# Patient Record
Sex: Female | Born: 1995 | Marital: Married | Smoking: Never smoker
Health system: Southern US, Community
[De-identification: ages and names within clinical notes are randomized; demographics above are authoritative.]

## PROBLEM LIST (undated history)

## (undated) DIAGNOSIS — F419 Anxiety disorder, unspecified: Secondary | ICD-10-CM

## (undated) DIAGNOSIS — G90A Postural orthostatic tachycardia syndrome (POTS): Secondary | ICD-10-CM

## (undated) DIAGNOSIS — R000 Tachycardia, unspecified: Secondary | ICD-10-CM

## (undated) DIAGNOSIS — K219 Gastro-esophageal reflux disease without esophagitis: Secondary | ICD-10-CM

## (undated) DIAGNOSIS — I951 Orthostatic hypotension: Secondary | ICD-10-CM

## (undated) DIAGNOSIS — I498 Other specified cardiac arrhythmias: Secondary | ICD-10-CM

## (undated) HISTORY — DX: Anxiety disorder, unspecified: F41.9

## (undated) HISTORY — DX: Other specified cardiac arrhythmias: I49.8

## (undated) HISTORY — PX: NO PAST SURGERIES: SHX2092

## (undated) HISTORY — DX: Orthostatic hypotension: I95.1

## (undated) HISTORY — DX: Gastro-esophageal reflux disease without esophagitis: K21.9

## (undated) HISTORY — DX: Postural orthostatic tachycardia syndrome (POTS): G90.A

## (undated) HISTORY — DX: Tachycardia, unspecified: R00.0

---

## 2017-02-09 ENCOUNTER — Encounter: Payer: Self-pay | Admitting: Obstetrics and Gynecology

## 2017-02-09 ENCOUNTER — Ambulatory Visit (INDEPENDENT_AMBULATORY_CARE_PROVIDER_SITE_OTHER): Payer: BLUE CROSS/BLUE SHIELD | Admitting: Obstetrics and Gynecology

## 2017-02-09 VITALS — BP 118/74 | Ht 64.0 in | Wt 150.0 lb

## 2017-02-09 DIAGNOSIS — Z1331 Encounter for screening for depression: Secondary | ICD-10-CM

## 2017-02-09 DIAGNOSIS — Z3041 Encounter for surveillance of contraceptive pills: Secondary | ICD-10-CM

## 2017-02-09 DIAGNOSIS — Z124 Encounter for screening for malignant neoplasm of cervix: Secondary | ICD-10-CM

## 2017-02-09 DIAGNOSIS — Z1389 Encounter for screening for other disorder: Secondary | ICD-10-CM | POA: Diagnosis not present

## 2017-02-09 DIAGNOSIS — Z1339 Encounter for screening examination for other mental health and behavioral disorders: Secondary | ICD-10-CM

## 2017-02-09 DIAGNOSIS — Z01419 Encounter for gynecological examination (general) (routine) without abnormal findings: Secondary | ICD-10-CM | POA: Diagnosis not present

## 2017-02-09 LAB — HM PAP SMEAR: HM Pap smear: NORMAL

## 2017-02-09 MED ORDER — NORETHINDRONE 0.35 MG PO TABS: 1.0000 | ORAL_TABLET | Freq: Every day | ORAL | 4 refills | Status: DC

## 2017-02-09 NOTE — Progress Notes (Signed)
Gynecology Annual Exam  PCP: Patient, No Pcp Per  Chief Complaint  Patient presents with  . Annual Exam   History of Present Illness:  [name] is a 21 y.o. G0P0000 who LMP was Patient's last menstrual period was 01/19/2017., presents today for her annual examination.  Her menses are irregular, lasting a few days.  Dysmenorrhea none.   She is single partner, contraception - oral progesterone-only contraceptive.  Last Pap: never had Hx of STDs: none  There is a FH of breast cancer. Her mother was diagnosed at age 22. There is no FH of ovarian cancer. The patient does not do self-breast exams.  Tobacco use: The patient denies current or previous tobacco use. Alcohol use: social drinker Exercise: moderately active  The patient wears seatbelts: yes.   The patient reports that domestic violence in her life is absent.   Past Medical History:  Diagnosis Date  . Anxiety   . POTS (postural orthostatic tachycardia syndrome)     No past surgical history on file.  Prior to Admission medications   Medication Sig Start Date End Date Taking? Authorizing Provider  metoprolol tartrate (LOPRESSOR) 50 MG tablet Take 50 mg by mouth 2 (two) times daily.   Yes [provider]  midodrine (PROAMATINE) 5 MG tablet Take 5 mg by mouth 3 (three) times daily with meals.   Yes [provider]  norethindrone (MICRONOR,CAMILA,ERRIN) 0.35 MG tablet Take 1 tablet by mouth daily.   Yes [provider]    Allergies  Allergen Reactions  . Citrus    Gynecologic History: Patient's last menstrual period was 01/19/2017.  Obstetric History: G0P0000  Social History   Social History  . Marital status: Married    Spouse name: N/A  . Number of children: N/A  . Years of education: N/A   Occupational History  . Not on file.   Social History Main Topics  . Smoking status: Never Smoker  . Smokeless tobacco: Never Used  . Alcohol use Yes  . Drug use: No  . Sexual  activity: Yes    Birth control/ protection: Pill   Other Topics Concern  . Not on file   Social History Narrative  . No narrative on file    Family History  Problem Relation Age of Onset  . Breast cancer Mother   . Hypertension Father   . Diabetes Mellitus II Paternal Aunt   . Diabetes Mellitus II Paternal Grandfather     Review of Systems  Constitutional: Negative.   HENT: Negative.   Eyes: Negative.   Respiratory: Negative.   Cardiovascular: Negative.   Gastrointestinal: Negative.   Genitourinary: Negative.   Musculoskeletal: Negative.   Skin: Negative.   Neurological: Negative.   Psychiatric/Behavioral: Negative.      Physical Exam BP 118/74   Ht 5\' 4"  (1.626 m)   Wt 150 lb (68 kg)   LMP 01/19/2017   BMI 25.75 kg/m    Physical Exam  Constitutional: She is oriented to person, place, and time. She appears well-developed and well-nourished. No distress.  Genitourinary: Vagina normal and uterus normal. Pelvic exam was performed with patient supine. There is no rash, tenderness or lesion on the right labia. There is no rash, tenderness or lesion on the left labia. Vagina exhibits no lesion. No tenderness or bleeding in the vagina. No signs of injury around the vagina. Right adnexum does not display mass, does not display tenderness and does not display fullness. Left adnexum does not display mass, does not  display tenderness and does not display fullness.  Cervix is nulliparous. Cervix does not exhibit motion tenderness, lesion, friability or polyp.   Uterus is mobile and anteverted. Uterus is not enlarged, tender, exhibiting a mass or irregular (is regular).  HENT:  Head: Normocephalic and atraumatic.  Eyes: EOM are normal. No scleral icterus.  Neck: Normal range of motion. Neck supple. No thyromegaly present.  Cardiovascular: Normal rate, regular rhythm and normal heart sounds.  Exam reveals no gallop and no friction rub.   No murmur heard. Pulmonary/Chest: Effort  normal and breath sounds normal. No respiratory distress. She has no wheezes. She has no rales.  Abdominal: Soft. Bowel sounds are normal. She exhibits no distension and no mass. There is no tenderness. There is no rebound and no guarding.  Musculoskeletal: Normal range of motion. She exhibits no edema.  Lymphadenopathy:    She has no cervical adenopathy.  Neurological: She is alert and oriented to person, place, and time. No cranial nerve deficit.  Skin: Skin is warm and dry. No erythema.  Psychiatric: She has a normal mood and affect. Her behavior is normal. Judgment normal.   Female chaperone present for pelvic and breast  portions of the physical exam  Results: AUDIT Questionnaire (screen for alcoholism): 3 PHQ-9: 11 (states she is doing well. Symptoms related to POTS)  Assessment: 21 y.o. G0P0000 female here for routine annual gynecologic examination  Plan: Problem List Items Addressed This Visit    None    Visit Diagnoses    Women's annual routine gynecological examination    -  Primary   Relevant Medications   norethindrone (MICRONOR,CAMILA,ERRIN) 0.35 MG tablet   Other Relevant Orders   IGP, rfx Aptima HPV ASCU   Pap smear for cervical cancer screening       Relevant Orders   IGP, rfx Aptima HPV ASCU   Screening for alcohol problem       Screening for depression       Encounter for surveillance of contraceptive pills       Relevant Medications   norethindrone (MICRONOR,CAMILA,ERRIN) 0.35 MG tablet      Screening: -- Blood pressure screen normal -- Weight screening: normal -- Depression screening negative (PHQ-9) -- Nutrition: normal -- cholesterol screening: not due for screening -- osteoporosis screening: not due -- tobacco screening: not using -- alcohol screening: AUDIT questionnaire indicates low-risk usage. -- family history of breast cancer screening: done. not at high risk. -- no evidence of domestic violence or intimate partner violence. -- STD  screening: gonorrhea/chlamydia NAAT not collected per patient request. -- pap smear collected per ASCCP guidelines  Thomasene MohairStephen Deairra Halleck, MD 02/09/2017 2:24 PM

## 2017-02-12 LAB — IGP, RFX APTIMA HPV ASCU: PAP SMEAR COMMENT: 0

## 2017-02-16 ENCOUNTER — Encounter: Payer: Self-pay | Admitting: Obstetrics and Gynecology

## 2017-07-27 ENCOUNTER — Encounter: Payer: Self-pay | Admitting: Obstetrics and Gynecology

## 2017-07-27 ENCOUNTER — Ambulatory Visit: Payer: BLUE CROSS/BLUE SHIELD | Admitting: Obstetrics and Gynecology

## 2017-07-27 VITALS — BP 118/74 | Wt 153.0 lb

## 2017-07-27 DIAGNOSIS — N760 Acute vaginitis: Secondary | ICD-10-CM

## 2017-07-27 DIAGNOSIS — B9689 Other specified bacterial agents as the cause of diseases classified elsewhere: Secondary | ICD-10-CM

## 2017-07-27 LAB — POCT WET PREP WITH KOH
KOH Prep POC: NEGATIVE
PH, VAGINAL: 4.5
TRICHOMONAS UA: NEGATIVE

## 2017-07-27 MED ORDER — METRONIDAZOLE 500 MG PO TABS: 500.0000 mg | ORAL_TABLET | Freq: Two times a day (BID) | ORAL | 0 refills | Status: AC

## 2017-07-27 NOTE — Progress Notes (Signed)
Obstetrics & Gynecology Office Visit   Chief Complaint  Patient presents with  . Vaginitis  . Vaginal Discharge    light pink d/c   History of Present Illness: Vaginitis: Patient complains of an abnormal vaginal discharge for 2 months. Vaginal symptoms include odor and vulvar itching.Vulvar symptoms include none.STI Risk: Very low risk of STD exposureDischarge described as: malodorous.Other associated symptoms: local irritation and odor.Menstrual pattern: She had been bleeding irregularly. Contraception: oral progesterone-only contraceptive.  Several month history of abnormal odor.  She had a menses last week lasting until Friday. She had some light-pink spotting, malodorous discharge, and occasional pink spotting on the tissue.     Past Medical History:  Diagnosis Date  . Anxiety   . POTS (postural orthostatic tachycardia syndrome)     Past Surgical History:  Procedure Laterality Date  . NO PAST SURGERIES      Gynecologic History: Patient's last menstrual period was 07/19/2017.  Obstetric History: G0P0000  Family History  Problem Relation Age of Onset  . Breast cancer Mother   . Hypertension Father   . Diabetes Mellitus II Paternal Aunt   . Diabetes Mellitus II Paternal Grandfather     Social History   Socioeconomic History  . Marital status: Married    Spouse name: Not on file  . Number of children: Not on file  . Years of education: Not on file  . Highest education level: Not on file  Social Needs  . Financial resource strain: Not on file  . Food insecurity - worry: Not on file  . Food insecurity - inability: Not on file  . Transportation needs - medical: Not on file  . Transportation needs - non-medical: Not on file  Occupational History  . Not on file  Tobacco Use  . Smoking status: Never Smoker  . Smokeless tobacco: Never Used  Substance and Sexual Activity  . Alcohol use: Yes  . Drug use: No  . Sexual activity: Yes    Birth control/protection: Pill    Other Topics Concern  . Not on file  Social History Narrative  . Not on file    Allergies  Allergen Reactions  . Citrus     Prior to Admission medications   Medication Sig Start Date End Date Taking? Authorizing Provider  metoprolol tartrate (LOPRESSOR) 50 MG tablet Take 50 mg by mouth 2 (two) times daily.    [provider]  midodrine (PROAMATINE) 5 MG tablet Take 5 mg by mouth 3 (three) times daily with meals.    [provider]  norethindrone (MICRONOR,CAMILA,ERRIN) 0.35 MG tablet Take 1 tablet (0.35 mg total) by mouth daily. 02/09/17 05/04/17  Conard Novak, MD   Review of Systems  Constitutional: Negative.  Negative for fever.  HENT: Negative.   Eyes: Negative.   Respiratory: Negative.   Cardiovascular: Negative.   Gastrointestinal: Negative.  Negative for abdominal pain and blood in stool.  Genitourinary: Negative.        See HPI  Musculoskeletal: Negative.   Skin: Negative.   Neurological: Negative.   Psychiatric/Behavioral: Negative.      Physical Exam BP 118/74   Wt 153 lb (69.4 kg)   LMP 07/19/2017   BMI 26.26 kg/m  Patient's last menstrual period was 07/19/2017. Physical Exam  Constitutional: She appears well-developed and well-nourished. No distress.  Genitourinary: Vagina normal and uterus normal. Pelvic exam was performed with patient supine. There is no rash, tenderness or lesion on the right labia. There is no rash, tenderness or lesion  on the left labia. No tenderness or bleeding in the vagina. No vaginal discharge found. Right adnexum does not display mass, does not display tenderness and does not display fullness. Left adnexum does not display mass, does not display tenderness and does not display fullness. Cervix does not exhibit motion tenderness, lesion, discharge or polyp.   Uterus is mobile and anteverted. Uterus is not enlarged, tender, exhibiting a mass or irregular (is regular).  Cardiovascular: Normal rate and regular  rhythm.  Pulmonary/Chest: Effort normal and breath sounds normal.  Abdominal: Soft. Bowel sounds are normal. She exhibits no distension and no mass. There is no tenderness. There is no guarding.  Psychiatric: She has a normal mood and affect. Her behavior is normal. Judgment normal.   Female chaperone present for pelvic and breast  portions of the physical exam  Wet Prep: PH: 4.5 Clue Cells: Positive Fungal elements: Negative Trichomonas: Negative  Assessment: 22 y.o. G0P0000 female here for  1. Bacterial vaginosis      Plan: Problem List Items Addressed This Visit    None    Visit Diagnoses    Bacterial vaginosis    -  Primary   Relevant Medications   metroNIDAZOLE (FLAGYL) 500 MG tablet   Other Relevant Orders   POCT Wet Prep with KOH (Completed)      Thomasene MohairStephen Makendra Vigeant, MD 07/27/2017 5:30 PM

## 2017-07-27 NOTE — Patient Instructions (Signed)
Bacterial Vaginosis Bacterial vaginosis is a vaginal infection that occurs when the normal balance of bacteria in the vagina is disrupted. It results from an overgrowth of certain bacteria. This is the most common vaginal infection among women ages 15-44. Because bacterial vaginosis increases your risk for STIs (sexually transmitted infections), getting treated can help reduce your risk for chlamydia, gonorrhea, herpes, and HIV (human immunodeficiency virus). Treatment is also important for preventing complications in pregnant women, because this condition can cause an early (premature) delivery. What are the causes? This condition is caused by an increase in harmful bacteria that are normally present in small amounts in the vagina. However, the reason that the condition develops is not fully understood. What increases the risk? The following factors may make you more likely to develop this condition:  Having a new sexual partner or multiple sexual partners.  Having unprotected sex.  Douching.  Having an intrauterine device (IUD).  Smoking.  Drug and alcohol abuse.  Taking certain antibiotic medicines.  Being pregnant.  You cannot get bacterial vaginosis from toilet seats, bedding, swimming pools, or contact with objects around you. What are the signs or symptoms? Symptoms of this condition include:  Grey or white vaginal discharge. The discharge can also be watery or foamy.  A fish-like odor with discharge, especially after sexual intercourse or during menstruation.  Itching in and around the vagina.  Burning or pain with urination.  Some women with bacterial vaginosis have no signs or symptoms. How is this diagnosed? This condition is diagnosed based on:  Your medical history.  A physical exam of the vagina.  Testing a sample of vaginal fluid under a microscope to look for a large amount of bad bacteria or abnormal cells. Your health care provider may use a cotton swab  or a small wooden spatula to collect the sample.  How is this treated? This condition is treated with antibiotics. These may be given as a pill, a vaginal cream, or a medicine that is put into the vagina (suppository). If the condition comes back after treatment, a second round of antibiotics may be needed. Follow these instructions at home: Medicines  Take over-the-counter and prescription medicines only as told by your health care provider.  Take or use your antibiotic as told by your health care provider. Do not stop taking or using the antibiotic even if you start to feel better. General instructions  If you have a female sexual partner, tell her that you have a vaginal infection. She should see her health care provider and be treated if she has symptoms. If you have a female sexual partner, he does not need treatment.  During treatment: ? Avoid sexual activity until you finish treatment. ? Do not douche. ? Avoid alcohol as directed by your health care provider. ? Avoid breastfeeding as directed by your health care provider.  Drink enough water and fluids to keep your urine clear or pale yellow.  Keep the area around your vagina and rectum clean. ? Wash the area daily with warm water. ? Wipe yourself from front to back after using the toilet.  Keep all follow-up visits as told by your health care provider. This is important. How is this prevented?  Do not douche.  Wash the outside of your vagina with warm water only.  Use protection when having sex. This includes latex condoms and dental dams.  Limit how many sexual partners you have. To help prevent bacterial vaginosis, it is best to have sex with just   one partner (monogamous).  Make sure you and your sexual partner are tested for STIs.  Wear cotton or cotton-lined underwear.  Avoid wearing tight pants and pantyhose, especially during summer.  Limit the amount of alcohol that you drink.  Do not use any products that  contain nicotine or tobacco, such as cigarettes and e-cigarettes. If you need help quitting, ask your health care provider.  Do not use illegal drugs. Where to find more information:  Centers for Disease Control and Prevention: www.cdc.gov/std  American Sexual Health Association (ASHA): www.ashastd.org  [place] Department of Health and Human Services, Office on Women's Health: www.womenshealth.gov/ or https://www.womenshealth.gov/a-z-topics/bacterial-vaginosis Contact a health care provider if:  Your symptoms do not improve, even after treatment.  You have more discharge or pain when urinating.  You have a fever.  You have pain in your abdomen.  You have pain during sex.  You have vaginal bleeding between periods. Summary  Bacterial vaginosis is a vaginal infection that occurs when the normal balance of bacteria in the vagina is disrupted.  Because bacterial vaginosis increases your risk for STIs (sexually transmitted infections), getting treated can help reduce your risk for chlamydia, gonorrhea, herpes, and HIV (human immunodeficiency virus). Treatment is also important for preventing complications in pregnant women, because the condition can cause an early (premature) delivery.  This condition is treated with antibiotic medicines. These may be given as a pill, a vaginal cream, or a medicine that is put into the vagina (suppository). This information is not intended to replace advice given to you by your health care provider. Make sure you discuss any questions you have with your health care provider. Document Released: 06/28/2005 Document Revised: 11/01/2016 Document Reviewed: 03/13/2016 Elsevier Interactive Patient Education  2018 [employer]  

## 2017-12-06 ENCOUNTER — Encounter: Payer: Self-pay | Admitting: Emergency Medicine

## 2017-12-06 ENCOUNTER — Emergency Department: Payer: BLUE CROSS/BLUE SHIELD

## 2017-12-06 ENCOUNTER — Emergency Department
Admission: EM | Admit: 2017-12-06 | Discharge: 2017-12-06 | Disposition: A | Discharge status: Home / Self Care | Payer: BLUE CROSS/BLUE SHIELD | Attending: Emergency Medicine | Admitting: Emergency Medicine

## 2017-12-06 ENCOUNTER — Other Ambulatory Visit: Payer: Self-pay

## 2017-12-06 DIAGNOSIS — R197 Diarrhea, unspecified: Secondary | ICD-10-CM | POA: Insufficient documentation

## 2017-12-06 DIAGNOSIS — R1084 Generalized abdominal pain: Secondary | ICD-10-CM | POA: Diagnosis present

## 2017-12-06 DIAGNOSIS — Z79899 Other long term (current) drug therapy: Secondary | ICD-10-CM | POA: Insufficient documentation

## 2017-12-06 DIAGNOSIS — R0602 Shortness of breath: Secondary | ICD-10-CM | POA: Insufficient documentation

## 2017-12-06 DIAGNOSIS — E860 Dehydration: Secondary | ICD-10-CM | POA: Insufficient documentation

## 2017-12-06 DIAGNOSIS — R0781 Pleurodynia: Secondary | ICD-10-CM

## 2017-12-06 DIAGNOSIS — R55 Syncope and collapse: Secondary | ICD-10-CM | POA: Insufficient documentation

## 2017-12-06 LAB — COMPREHENSIVE METABOLIC PANEL
ALK PHOS: 57 U/L (ref 38–126)
ALT: 23 U/L (ref 14–54)
ANION GAP: 7 (ref 5–15)
AST: 23 U/L (ref 15–41)
Albumin: 4.9 g/dL (ref 3.5–5.0)
BILIRUBIN TOTAL: 0.8 mg/dL (ref 0.3–1.2)
BUN: 16 mg/dL (ref 6–20)
CALCIUM: 9.4 mg/dL (ref 8.9–10.3)
CO2: 22 mmol/L (ref 22–32)
Chloride: 106 mmol/L (ref 101–111)
Creatinine, Ser: 0.71 mg/dL (ref 0.44–1.00)
GFR calc Af Amer: >60 mL/min (ref >60)
Glucose, Bld: 122 mg/dL — ABNORMAL HIGH (ref 65–99)
Potassium: 3.8 mmol/L (ref 3.5–5.1)
Sodium: 135 mmol/L (ref 135–145)
TOTAL PROTEIN: 8.1 g/dL (ref 6.5–8.1)

## 2017-12-06 LAB — POCT PREGNANCY, URINE: PREG TEST UR: NEGATIVE

## 2017-12-06 LAB — URINALYSIS, COMPLETE (UACMP) WITH MICROSCOPIC
BACTERIA UA: NONE SEEN
BILIRUBIN URINE: NEGATIVE
Glucose, UA: NEGATIVE mg/dL
Ketones, ur: NEGATIVE mg/dL
NITRITE: NEGATIVE
PH: 5 (ref 5.0–8.0)
Protein, ur: NEGATIVE mg/dL
SPECIFIC GRAVITY, URINE: 1.016 (ref 1.005–1.030)

## 2017-12-06 LAB — CBC
HCT: 40.8 % (ref 35.0–47.0)
HEMOGLOBIN: 14.3 g/dL (ref 12.0–16.0)
MCH: 31.9 pg (ref 26.0–34.0)
MCHC: 35 g/dL (ref 32.0–36.0)
MCV: 91.1 fL (ref 80.0–100.0)
Platelets: 307 10*3/uL (ref 150–440)
RBC: 4.47 MIL/uL (ref 3.80–5.20)
RDW: 12.9 % (ref 11.5–14.5)
WBC: 11.5 10*3/uL — ABNORMAL HIGH (ref 3.6–11.0)

## 2017-12-06 LAB — TSH: TSH: 2.069 u[IU]/mL (ref 0.350–4.500)

## 2017-12-06 LAB — FIBRIN DERIVATIVES D-DIMER (ARMC ONLY): FIBRIN DERIVATIVES D-DIMER (ARMC): 851.97 ng{FEU}/mL — AB (ref 0.00–499.00)

## 2017-12-06 LAB — TROPONIN I

## 2017-12-06 LAB — T4, FREE: FREE T4: 0.74 ng/dL — AB (ref 0.82–1.77)

## 2017-12-06 MED ORDER — IOPAMIDOL (ISOVUE-370) INJECTION 76%
75.0000 mL | Freq: Once | INTRAVENOUS | Status: AC | PRN
  Administered 2017-12-06: 75 mL via INTRAVENOUS

## 2017-12-06 MED ORDER — SODIUM CHLORIDE 0.9 % IV BOLUS
1000.0000 mL | Freq: Once | INTRAVENOUS | Status: AC
  Administered 2017-12-06: 1000 mL via INTRAVENOUS

## 2017-12-06 MED ORDER — ONDANSETRON HCL 4 MG/2ML IJ SOLN
4.0000 mg | Freq: Once | INTRAMUSCULAR | Status: AC
  Administered 2017-12-06: 4 mg via INTRAVENOUS
  Filled 2017-12-06: qty 2

## 2017-12-06 NOTE — Discharge Instructions (Signed)
Your blood tests today are all normal.  Your CT scan of the chest was normal as well and does not show any pneumonia, collapsed lung, or blood clot in the lungs.  Continue drinking lots of fluids and follow up with your doctor for continued monitoring of your symptoms.

## 2017-12-06 NOTE — ED Provider Notes (Signed)
[facility] Emergency Department Provider Note  ____________________________________________  Time seen: Approximately 11:14 AM  I have reviewed the triage vital signs and the nursing notes.   HISTORY  Chief Complaint Abdominal Pain; Loss of Consciousness; and Diarrhea    HPI Denise Tucker is a 22 y.o. female who complains of generalized abdominal pain, generalized weakness, diarrhea since yesterday. This morning she got dizzy and thinks he passed out briefly. Denies any significant injury. she does feel like she is mildly short of breath and has discomfort when she takes a deep breath. Otherwise no chest pain. No recent travel, hospitalization or surgery. Only medical history is of POTS. patient denies fevers chills or sweats, no headache back pain or belly pain..    Past Medical History:  Diagnosis Date  . Anxiety   . POTS (postural orthostatic tachycardia syndrome)      There are no active problems to display for this patient.    Past Surgical History:  Procedure Laterality Date  . NO PAST SURGERIES       Prior to Admission medications   Medication Sig Start Date End Date Taking? Authorizing Provider  metoprolol tartrate (LOPRESSOR) 50 MG tablet Take 50 mg by mouth 2 (two) times daily.    [provider]  midodrine (PROAMATINE) 5 MG tablet Take 5 mg by mouth 3 (three) times daily with meals.    [provider]  norethindrone (MICRONOR,CAMILA,ERRIN) 0.35 MG tablet Take 1 tablet (0.35 mg total) by mouth daily. 02/09/17 05/04/17  Conard Novak, MD     Allergies Citrus   Family History  Problem Relation Age of Onset  . Breast cancer Mother   . Hypertension Father   . Diabetes Mellitus II Paternal Aunt   . Diabetes Mellitus II Paternal Grandfather     Social History Social History   Tobacco Use  . Smoking status: Never Smoker  . Smokeless tobacco: Never Used  Substance Use Topics  . Alcohol use: Yes  . Drug  use: No    Review of Systems  Constitutional:   No fever or chills.  ENT:   No sore throat. No rhinorrhea. Cardiovascular:  positive as above for chest discomfort and possible syncope. Respiratory:  positive shortness of breath without cough. Gastrointestinal:  positive as above for abdominal pain and diarrhea Musculoskeletal:   Negative for focal pain or swelling All other systems reviewed and are negative except as documented above in ROS and HPI.  ____________________________________________   PHYSICAL EXAM:  VITAL SIGNS: ED Triage Vitals  Enc Vitals Group     BP 12/06/17 0704 130/83     Pulse Rate 12/06/17 0704 (!) 141     Resp 12/06/17 0704 18     Temp 12/06/17 0704 98.7 F (37.1 C)     Temp Source 12/06/17 0704 Oral     SpO2 12/06/17 0704 99 %     Weight 12/06/17 0647 130 lb (59 kg)     Height 12/06/17 0647  (1.651 m)     Head Circumference --      Peak Flow --      Pain Score 12/06/17 0647 7     Pain Loc --      Pain Edu? --      Excl. in GC? --     Vital signs reviewed, nursing assessments reviewed.   Constitutional:   Alert and oriented. Well appearing and in no distress. Eyes:   Conjunctivae are normal. EOMI. PERRL. ENT  Head:   Normocephalic and atraumatic.      Nose:   No congestion/rhinnorhea.       Mouth/Throat:   MMM, no pharyngeal erythema. No peritonsillar mass.       Neck:   No meningismus. Full ROM. Hematological/Lymphatic/Immunilogical:   No cervical lymphadenopathy. Cardiovascular:   RRR. Symmetric bilateral radial and DP pulses.  No murmurs.  Respiratory:   Normal respiratory effort without tachypnea/retractions. Breath sounds are clear and equal bilaterally. No wheezes/rales/rhonchi. Gastrointestinal:   Soft without focal tenderness. Non distended. There is no CVA tenderness.  No rebound, rigidity, or guarding.  Musculoskeletal:   Normal range of motion in all extremities. No joint effusions.  No lower extremity tenderness.  No  edema. Neurologic:   Normal speech and language.  Motor grossly intact. No acute focal neurologic deficits are appreciated.  Skin:    Skin is warm, dry and intact. No rash noted.  No petechiae, purpura, or bullae.  ____________________________________________    LABS (pertinent positives/negatives) (all labs ordered are listed, but only abnormal results are displayed) Labs Reviewed  CBC - Abnormal; Notable for the following components:      Result Value   WBC 11.5 (*)    All other components within normal limits  COMPREHENSIVE METABOLIC PANEL - Abnormal; Notable for the following components:   Glucose, Bld 122 (*)    All other components within normal limits  URINALYSIS, COMPLETE (UACMP) WITH MICROSCOPIC - Abnormal; Notable for the following components:   Color, Urine YELLOW (*)    APPearance CLEAR (*)    Hgb urine dipstick MODERATE (*)    Leukocytes, UA SMALL (*)    All other components within normal limits  T4, FREE - Abnormal; Notable for the following components:   Free T4 0.74 (*)    All other components within normal limits  FIBRIN DERIVATIVES D-DIMER ([facility] ONLY) - Abnormal; Notable for the following components:   Fibrin derivatives D-dimer ([facility]) 851.97 (*)    All other components within normal limits  TROPONIN I  TSH  POCT PREGNANCY, URINE   ____________________________________________   EKG  interpreted by me Sinus tachycardia rate 132. Normal axis intervals QRS ST segments and T waves.  ____________________________________________    RADIOLOGY  Ct Angio Chest Pe W And/or Wo Contrast  Result Date: 12/06/2017 CLINICAL DATA:  Abdominal pain. Syncope. Shortness of breath. Pott's disease. EXAM: CT ANGIOGRAPHY CHEST WITH CONTRAST TECHNIQUE: Multidetector CT imaging of the chest was performed using the standard protocol during bolus administration of intravenous contrast. Multiplanar CT image reconstructions and MIPs were obtained to evaluate the vascular  anatomy. CONTRAST:  75mL ISOVUE-370 IOPAMIDOL (ISOVUE-370) INJECTION 76% COMPARISON:  None. FINDINGS: Cardiovascular: The quality of this exam for evaluation of pulmonary embolism is good. No evidence of pulmonary embolism. Normal heart size, without pericardial effusion. Normal aortic caliber. Mediastinum/Nodes: No mediastinal or hilar adenopathy. Residual thymic tissue in the anterior mediastinum. Lungs/Pleura: No pleural fluid.  Clear lungs. Upper Abdomen: Normal imaged portions of the liver, spleen, stomach, adrenal glands, kidneys. Musculoskeletal: No acute osseous abnormality. Review of the MIP images confirms the above findings. IMPRESSION: No evidence of pulmonary embolism or explanation for syncope. Electronically Signed   By: Jeronimo Greaves M.D.   On: 12/06/2017 09:45    ____________________________________________   PROCEDURES Procedures  ____________________________________________  DIFFERENTIAL DIAGNOSIS   pneumonia, pulmonary embolism, viral syndrome, dehydration. low suspicion for dissection or ACS  CLINICAL IMPRESSION / ASSESSMENT AND PLAN / ED COURSE  Pertinent labs & imaging results that were  available during my care of the patient were reviewed by me and considered in my medical decision making (see chart for details).    patient presents with atypical chest discomfort, dizziness, possible syncope. Tachycardic. Initial labs unremarkable, d-dimer elevated. Check CT angiogram to evaluate for pulmonary embolism.  Clinical Course as of Dec 06 1117  Tue Dec 06, 2017  1007 CT negative for pna / pe, or other acute process.  Labs negative. Workup negative. Tachycardia improved after IVF. Will DC home to f/u PCP   [PS]    Clinical Course User Index [PS] Sharman Cheek, MD     ____________________________________________   FINAL CLINICAL IMPRESSION(S) / ED DIAGNOSES    Final diagnoses:  Dehydration  Generalized abdominal pain  Pleuritic chest pain     ED  Discharge Orders    None      Portions of this note were generated with dragon dictation software. Dictation errors may occur despite best attempts at proofreading.    Sharman Cheek, MD 12/06/17 1119

## 2017-12-06 NOTE — ED Triage Notes (Signed)
Patient ambulatory to triage with steady gait, without difficulty or distress noted; pt reports diarrhea, generalized abd pain since last night; syncope this am; hx POTTS

## 2017-12-06 NOTE — ED Notes (Addendum)
Blue and red top tube sent to lab with save labels 

## 2018-03-27 ENCOUNTER — Encounter: Payer: Self-pay | Admitting: Obstetrics and Gynecology

## 2018-03-27 ENCOUNTER — Ambulatory Visit (INDEPENDENT_AMBULATORY_CARE_PROVIDER_SITE_OTHER): Payer: BLUE CROSS/BLUE SHIELD | Admitting: Obstetrics and Gynecology

## 2018-03-27 VITALS — BP 124/78 | Ht 64.0 in | Wt 154.0 lb

## 2018-03-27 DIAGNOSIS — Z1331 Encounter for screening for depression: Secondary | ICD-10-CM

## 2018-03-27 DIAGNOSIS — Z3041 Encounter for surveillance of contraceptive pills: Secondary | ICD-10-CM | POA: Diagnosis not present

## 2018-03-27 DIAGNOSIS — Z01419 Encounter for gynecological examination (general) (routine) without abnormal findings: Secondary | ICD-10-CM | POA: Diagnosis not present

## 2018-03-27 DIAGNOSIS — Z1339 Encounter for screening examination for other mental health and behavioral disorders: Secondary | ICD-10-CM | POA: Diagnosis not present

## 2018-03-27 MED ORDER — NORETHINDRONE 0.35 MG PO TABS: 1.0000 | ORAL_TABLET | Freq: Every day | ORAL | 4 refills | Status: DC

## 2018-03-27 NOTE — Progress Notes (Signed)
Gynecology Annual Exam  PCP: Patient, No Pcp Per  Chief Complaint  Patient presents with  . Annual Exam   History of Present Illness:  [name] is a 22 y.o. G0P0000 who LMP was Patient's last menstrual period was 02/23/2018., presents today for her annual examination.  Her menses are regular every 28-30 days, lasting 4 day(s).  Dysmenorrhea none. She does not have intermenstrual bleeding.  She is single partner, contraception - oral progesterone-only contraceptive.  Last Pap: 1 year ago  Results were: no abnormalities /neg HPV DNA not done Hx of STDs: none  There is a FH of breast cancer in her mother at the age of 47. There is no FH of ovarian cancer. The patient does do self-breast exams.  Tobacco use: The patient denies current or previous tobacco use. Alcohol use: social drinker Exercise: not active  The patient wears seatbelts: yes.   The patient reports that domestic violence in her life is absent.   She has seen a cardiologist in early June. 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  She had another CT scan after May that showed a hiatal hernia.  She is taking prevacid for reflux. She has not had an upper endoscopy.    Past Medical History:  Diagnosis Date  . Anxiety   . POTS (postural orthostatic tachycardia syndrome)     Past Surgical History:  Procedure Laterality Date  . NO PAST SURGERIES      Prior to Admission medications   Medication Sig Start Date End Date Taking? Authorizing Provider  metoprolol succinate (TOPROL-XL) 100 MG 24 hr tablet TK 1 T PO ONCE D 03/21/18  Yes [provider]  midodrine (PROAMATINE) 10 MG tablet TK 1 T PO TID 03/21/18  Yes [provider]  pantoprazole (PROTONIX) 20 MG tablet TK 1 T PO ONCE D 03/21/18  Yes [provider]  metoprolol tartrate (LOPRESSOR) 50 MG tablet Take 50 mg by mouth 2 (two) times daily.    [provider]  midodrine (PROAMATINE) 5 MG tablet Take 5 mg by  mouth 3 (three) times daily with meals.    [provider]  norethindrone (MICRONOR,CAMILA,ERRIN) 0.35 MG tablet Take 1 tablet (0.35 mg total) by mouth daily. 02/09/17 05/04/17  Conard Novak, MD    Allergies  Allergen Reactions  . Citrus    Obstetric History: G0P0000  Social History   Socioeconomic History  . Marital status: Married    Spouse name: Not on file  . Number of children: Not on file  . Years of education: Not on file  . Highest education level: Not on file  Occupational History  . Not on file  Social Needs  . Financial resource strain: Not on file  . Food insecurity:    Worry: Not on file    Inability: Not on file  . Transportation needs:    Medical: Not on file    Non-medical: Not on file  Tobacco Use  . Smoking status: Never Smoker  . Smokeless tobacco: Never Used  Substance and Sexual Activity  . Alcohol use: Yes  . Drug use: No  . Sexual activity: Yes    Birth control/protection: Pill  Lifestyle  . Physical activity:    Days per week: Not on file    Minutes per session: Not on file  . Stress: Not on file  Relationships  . Social connections:    Talks on phone: Not on file    Gets  together: Not on file    Attends religious service: Not on file    Active member of club or organization: Not on file    Attends meetings of clubs or organizations: Not on file    Relationship status: Not on file  . Intimate partner violence:    Fear of current or ex partner: Not on file    Emotionally abused: Not on file    Physically abused: Not on file    Forced sexual activity: Not on file  Other Topics Concern  . Not on file  Social History Narrative  . Not on file    Family History  Problem Relation Age of Onset  . Breast cancer Mother   . Hypertension Father   . Diabetes Mellitus II Paternal Aunt   . Diabetes Mellitus II Paternal Grandfather     Review of Systems  Constitutional: Negative.   HENT: Negative.   Eyes: Negative.    Respiratory: Negative.   Cardiovascular: Negative.   Gastrointestinal: Negative.   Genitourinary: Negative.   Musculoskeletal: Negative.   Skin: Negative.   Neurological: Negative.   Psychiatric/Behavioral: Negative.      Physical Exam BP 124/78   Ht 5\' 4"  (1.626 m)   Wt 154 lb (69.9 kg)   LMP 02/23/2018   BMI 26.43 kg/m    Physical Exam  Constitutional: She is oriented to person, place, and time. She appears well-developed and well-nourished. No distress.  HENT:  Head: Normocephalic and atraumatic.  Eyes: EOM are normal. No scleral icterus.  Neck: Normal range of motion. Neck supple.  Cardiovascular: Normal rate and regular rhythm.  Pulmonary/Chest: Effort normal and breath sounds normal. No respiratory distress. She has no wheezes. She has no rales.  Abdominal: Soft. Bowel sounds are normal. She exhibits no distension and no mass. There is no tenderness. There is no rebound and no guarding.  Musculoskeletal: Normal range of motion. She exhibits no edema.  Neurological: She is alert and oriented to person, place, and time. No cranial nerve deficit.  Skin: Skin is warm and dry. No erythema.  Psychiatric: She has a normal mood and affect. Her behavior is normal. Judgment normal.    Female chaperone present for pelvic and breast  portions of the physical exam  Results: AUDIT Questionnaire (screen for alcoholism): 1 PHQ-9: 11  Assessment: 22 y.o. G0P0000 female here for routine annual gynecologic examination  Plan: Problem List Items Addressed This Visit    None    Visit Diagnoses    Women's annual routine gynecological examination    -  Primary   Relevant Medications   norethindrone (MICRONOR,CAMILA,ERRIN) 0.35 MG tablet   Screening for depression       Screening for alcoholism       Encounter for surveillance of contraceptive pills       Relevant Medications   norethindrone (MICRONOR,CAMILA,ERRIN) 0.35 MG tablet      Screening: -- Blood pressure screen  normal -- Weight screening: normal -- Depression screening negative (PHQ-9) -- Nutrition: normal -- cholesterol screening: not due for screening -- osteoporosis screening: not due -- tobacco screening: not using -- alcohol screening: AUDIT questionnaire indicates low-risk usage. -- family history of breast cancer screening: done. not at high risk. -- no evidence of domestic violence or intimate partner violence. -- STD screening: gonorrhea/chlamydia NAAT not collected per patient request. -- pap smear not collected per ASCCP guidelines -- flu vaccine plans to get -- HPV vaccination series: unsure.  Declines due to marital status.  Pelvic  exam: mutual decision made to forgo exam at this time with no symptoms and no indication.  Discussed that does not need pelvic exam every year. Patient elected to not undergo screening pelvic and breast exams given age and symptoms.   Thomasene MohairStephen Aulden Calise, MD  03/27/2018 10:25 AM

## 2018-03-30 ENCOUNTER — Other Ambulatory Visit: Payer: Self-pay | Admitting: Obstetrics and Gynecology

## 2018-03-30 DIAGNOSIS — Z01419 Encounter for gynecological examination (general) (routine) without abnormal findings: Secondary | ICD-10-CM

## 2018-03-30 DIAGNOSIS — Z3041 Encounter for surveillance of contraceptive pills: Secondary | ICD-10-CM

## 2018-06-25 ENCOUNTER — Other Ambulatory Visit: Payer: Self-pay | Admitting: Obstetrics and Gynecology

## 2018-06-25 DIAGNOSIS — Z01419 Encounter for gynecological examination (general) (routine) without abnormal findings: Secondary | ICD-10-CM

## 2018-06-25 DIAGNOSIS — Z3041 Encounter for surveillance of contraceptive pills: Secondary | ICD-10-CM

## 2019-03-13 ENCOUNTER — Other Ambulatory Visit: Payer: Self-pay

## 2019-03-13 ENCOUNTER — Encounter (HOSPITAL_COMMUNITY): Payer: Self-pay | Admitting: Emergency Medicine

## 2019-03-13 ENCOUNTER — Emergency Department (HOSPITAL_COMMUNITY)
Admission: EM | Admit: 2019-03-13 | Discharge: 2019-03-13 | Disposition: A | Discharge status: Home / Self Care | Payer: BC Managed Care – PPO | Attending: Emergency Medicine | Admitting: Emergency Medicine

## 2019-03-13 ENCOUNTER — Emergency Department (HOSPITAL_COMMUNITY): Payer: BC Managed Care – PPO

## 2019-03-13 DIAGNOSIS — M542 Cervicalgia: Secondary | ICD-10-CM

## 2019-03-13 DIAGNOSIS — R202 Paresthesia of skin: Secondary | ICD-10-CM

## 2019-03-13 DIAGNOSIS — Z79899 Other long term (current) drug therapy: Secondary | ICD-10-CM | POA: Insufficient documentation

## 2019-03-13 DIAGNOSIS — G542 Cervical root disorders, not elsewhere classified: Secondary | ICD-10-CM | POA: Diagnosis not present

## 2019-03-13 LAB — I-STAT BETA HCG BLOOD, ED (MC, WL, AP ONLY): I-stat hCG, quantitative: <5 m[IU]/mL (ref <5)

## 2019-03-13 MED ORDER — CYCLOBENZAPRINE HCL 5 MG PO TABS: 5.0000 mg | ORAL_TABLET | Freq: Two times a day (BID) | ORAL | 0 refills | Status: DC | PRN

## 2019-03-13 MED ORDER — KETOROLAC TROMETHAMINE 30 MG/ML IJ SOLN
30.0000 mg | Freq: Once | INTRAMUSCULAR | Status: AC
  Administered 2019-03-13: 30 mg via INTRAMUSCULAR
  Filled 2019-03-13: qty 1

## 2019-03-13 MED ORDER — METOCLOPRAMIDE HCL 10 MG PO TABS
5.0000 mg | ORAL_TABLET | Freq: Once | ORAL | Status: AC
  Administered 2019-03-13: 5 mg via ORAL
  Filled 2019-03-13: qty 1

## 2019-03-13 NOTE — ED Notes (Signed)
Pt states she woke up with numbness in chest and left arm yesterday. Denies any pain. Pt states she has a history of tachycardia.

## 2019-03-13 NOTE — ED Notes (Signed)
Pt given dc instructions and pt verbalizes understanding.   

## 2019-03-13 NOTE — ED Triage Notes (Signed)
Pt states yesterday woke up with left sided neck pain, denies recent injury/fever. States is having numbness to left cheek, left arm to fingertips. Pt able to move all extremities, no droop, drift, slurred speech, NAD at present.

## 2019-03-13 NOTE — Discharge Instructions (Signed)
Please read attached information. If you experience any new or worsening signs or symptoms please return to the emergency room for evaluation. Please follow-up with your primary care provider or specialist as discussed. Please use medication prescribed only as directed and discontinue taking if you have any concerning signs or symptoms.   °

## 2019-03-13 NOTE — ED Provider Notes (Signed)
[facility] EM[facility]ENC[facility] Provider Note   CSN: [id number] Arrival date & time: 03/13/19  1206     History   Chief Complaint Chief Complaint  Patient presents with  . Neck Pain  . Numbness    HPI Denise Tucker is a 23 y.o. female.     HPI   23 year old female with a history of pots syndrome presents today with complaints of neck pain.  Patient notes yesterday morning she woke up with pain in her left lateral and posterior neck.  She notes pain with range of motion and palpation.  She also notes she has associated tingling and slight decrease sensation in her left jaw arm and left lower extremity.  She notes some minor weakness compared to the right and her left arm and left leg.  She denies any right-sided deficits, no confusion.  She notes she always has a very minor headache and very slight lightheadedness, this is unchanged.  She denies any recent trauma or manipulation, no infectious etiology.  No history of the same.  Past Medical History:  Diagnosis Date  . Anxiety   . POTS (postural orthostatic tachycardia syndrome)     There are no active problems to display for this patient.   Past Surgical History:  Procedure Laterality Date  . NO PAST SURGERIES       OB History    Gravida  0   Para  0   Term  0   Preterm  0   AB  0   Living  0     SAB  0   TAB  0   Ectopic  0   Multiple  0   Live Births  0            Home Medications    Prior to Admission medications   Medication Sig Start Date End Date Taking? Authorizing Provider  cyclobenzaprine (FLEXERIL) 5 MG tablet Take 1 tablet (5 mg total) by mouth 2 (two) times daily as needed for muscle spasms. 03/13/19   Vicke Plotner, Tinnie GensJeffrey, PA-C  metoprolol succinate (TOPROL-XL) 100 MG 24 hr tablet TK 1 T PO ONCE D 03/21/18   [provider]  metoprolol tartrate (LOPRESSOR) 50 MG tablet Take 50 mg by mouth 2 (two) times daily.    [provider]  midodrine  (PROAMATINE) 10 MG tablet TK 1 T PO TID 03/21/18   [provider]  midodrine (PROAMATINE) 5 MG tablet Take 5 mg by mouth 3 (three) times daily with meals.    [provider]  norethindrone (MICRONOR,CAMILA,ERRIN) 0.35 MG tablet Take 1 tablet (0.35 mg total) by mouth daily. 03/27/18   Conard NovakJackson, Stephen D, MD  norethindrone (MICRONOR,CAMILA,ERRIN) 0.35 MG tablet TAKE 1 TABLET BY MOUTH DAILY 06/25/18   Conard NovakJackson, Stephen D, MD  pantoprazole (PROTONIX) 20 MG tablet TK 1 T PO ONCE D 03/21/18   [provider]    Family History Family History  Problem Relation Age of Onset  . Breast cancer Mother   . Hypertension Father   . Diabetes Mellitus II Paternal Aunt   . Diabetes Mellitus II Paternal Grandfather     Social History Social History   Tobacco Use  . Smoking status: Never Smoker  . Smokeless tobacco: Never Used  Substance Use Topics  . Alcohol use: Yes    Comment: rarely  . Drug use: No     Allergies   Citrus   Review of Systems Review of Systems  All other systems reviewed  and are negative.    Physical Exam Updated Vital Signs BP 119/76 (BP Location: Right Arm)   Pulse 73   Temp 98.5 F (36.9 C) (Oral)   Resp 16   SpO2 100%   Physical Exam Vitals signs and nursing note reviewed.  Constitutional:      Appearance: She is well-developed.  HENT:     Head: Normocephalic and atraumatic.  Eyes:     General: No scleral icterus.       Right eye: No discharge.        Left eye: No discharge.     Conjunctiva/sclera: Conjunctivae normal.     Pupils: Pupils are equal, round, and reactive to light.  Neck:     Musculoskeletal: Normal range of motion.     Vascular: No JVD.     Trachea: No tracheal deviation.  Pulmonary:     Effort: Pulmonary effort is normal.     Breath sounds: No stridor.  Neurological:     Mental Status: She is alert and oriented to person, place, and time.     Cranial Nerves: No cranial nerve deficit.     Sensory: Sensory  deficit present.     Coordination: Coordination normal.     Comments: Moves all extremities through full range of motion, ambulates with a steady gait, coordination is intact, facial symmetry normal - strength is 5 out of 5 to bilateral upper and lower extremities but is slightly weaker with the left arm and left leg at all joints-slight decrease sensation to the left jaw left upper extremity and left lower extremity-patellar and triceps reflexes 2+  Psychiatric:        Behavior: Behavior normal.        Thought Content: Thought content normal.        Judgment: Judgment normal.      ED Treatments / Results  Labs (all labs ordered are listed, but only abnormal results are displayed) Labs Reviewed  I-STAT BETA HCG BLOOD, ED ([facility], [facility], [facility] ONLY)    EKG EKG Interpretation  Date/Time:  Tuesday March 13 2019 12:24:18 EDT Ventricular Rate:  71 PR Interval:  140 QRS Duration: 86 QT Interval:  384 QTC Calculation: 417 R Axis:   91 Text Interpretation:  Normal sinus rhythm Rightward axis Borderline ECG when compared to prior, slower rate.  No sTEMI Confirmed by Theda Belfast ([id number]) on 03/13/2019 2:39:59 PM   Radiology Dg Cervical Spine Complete  Result Date: 03/13/2019 CLINICAL DATA:  Neck pain, numbness, no injury EXAM: CERVICAL SPINE - COMPLETE 4+ VIEW COMPARISON:  None. FINDINGS: No fracture or static subluxation of the cervical spine. Normal alignment. Disc spaces and vertebral body heights are well preserved. Bony neural foramina are patent. IMPRESSION: No fracture or static subluxation of the cervical spine. Disc spaces and vertebral body heights are well preserved. Cervical disc and neural foraminal pathology may be further evaluated by MRI if there are localizing neurological signs and symptoms. Electronically Signed   By: Lauralyn Primes M.D.   On: 03/13/2019 12:59    Procedures Procedures (including critical care time)  Medications Ordered in ED Medications  ketorolac (TORADOL)  30 MG/ML injection 30 mg (30 mg Intramuscular Given 03/13/19 1604)  metoCLOPramide (REGLAN) tablet 5 mg (5 mg Oral Given 03/13/19 1601)     Initial Impression / Assessment and Plan / ED Course  I have reviewed the triage vital signs and the nursing notes.  Pertinent labs & imaging results that were available during my care of the patient  were reviewed by me and considered in my medical decision making (see chart for details).        23 year old female presents today with complaints of neck pain tingling in her left upper and lower extremity and face.  Her presentation is atypical, she has 5 out of 5 strength in the extremities, the left is slightly weaker than the right but this appears to be somewhat inconsistent on her exam.  She has no facial asymmetry the way she describes her sensory change in the face arm and leg he has more tingling as opposed to decrease sensation.  She does have objective tension in the left trapezius and posterior cervical region raising my suspicion for muscular pain higher on the differential.  But given the neurological symptoms she is describing dissection and intracranial abnormality on my differential as well.  I discussed the case with on-call neurologist Dr. Leonel Ramsay who recommended MR of the brain and cervical spine.  I discussed with the patient, her and her husband do not want to proceed with MRI for financial concerns.  I discussed the above etiologies on my differential and that without an MRI would not be able to adequately diagnose potential life-threatening etiology.  They understand this and would like treatment for her discomfort presently.  They do understand that by not proceeding this could potentially worsen and cause death or permanent disability.  Upon reassessment after Toradol and Reglan patient has no neurological deficits she has equal strength to the bilateral upper and lower extremity with no sensory deficits.  I discussed strict return  precautions.  Patient verbalized understanding and agreement to today's plan had no further questions or concerns.  Final Clinical Impressions(s) / ED Diagnoses   Final diagnoses:  Paresthesia  Neck pain    ED Discharge Orders         Ordered    cyclobenzaprine (FLEXERIL) 5 MG tablet  2 times daily PRN     03/13/19 1639           Okey Regal, PA-C 03/13/19 1844    Tegeler, Gwenyth Allegra, MD 03/14/19 [phone]

## 2019-11-13 ENCOUNTER — Telehealth: Payer: Self-pay | Admitting: General Practice

## 2019-11-13 NOTE — Telephone Encounter (Signed)
   Went to chart to check medications. Pt said she was told by her doctor we can send refill for her heart meds. Advised since she is not yet a pt of our practice we cant send her any refill request. We need to wait for her referral and after she is seen that's when we can take care of her heart meds. Pt understood

## 2019-11-25 NOTE — Progress Notes (Signed)
Cardiology Office Note  Date:  11/26/2019   ID:  Alanee Ting, DOB [birth year], MRN [id number]  PCP:  Patient, No Pcp Per   Chief Complaint  Patient presents with  . New Patient (Initial Visit)    Pt states/ dizziness/ SOB & fatigue/ chest pain/ swelling in feet most times. Meds verbally reviewed w/ pt.     HPI:  Ms. Charlee Squibb is a 24 year old woman with past medical history of POTS seen [facility] in 2019 and was diagnosed with POTS after a tilt table test. Who presents for new patient evaluation for her tachycardia, near syncope/syncope  Prior work-up discussed, Additional testing performed at [facility] several years ago Records reviewed, positive tilt table study She has had some mild weight gain over the past several years with improvement of her near syncope and syncope  Has continued to have positional tachycardia On propranolol ER 60  >1 year Heart rate dramatically improved, symptoms moderated Without propranolol, with standing heart rate up to 120 to 150 At rest, 90 to 120 At rest today heart rates 60s to 70 on propranolol  No syncope in over 1 year Previously on midodrine , had itching, was on midorine 10 mg three times a day Has not tried Florinef  EKG personally reviewed by myself on todays visit Shows normal sinus rhythm rate 64 bpm no significant ST-T wave changes   PMH:   has a past medical history of Anxiety, GERD (gastroesophageal reflux disease), and POTS (postural orthostatic tachycardia syndrome).  PSH:    Past Surgical History:  Procedure Laterality Date  . NO PAST SURGERIES      Current Outpatient Medications  Medication Sig Dispense Refill  . pantoprazole (PROTONIX) 40 MG tablet Take 40 mg by mouth daily. Taking 1 tablet daily     No current facility-administered medications for this visit.     Allergies:   Citrus   Social History:  The patient  reports that she has never smoked. She has never used smokeless tobacco. She reports current alcohol  use. She reports that she does not use drugs.   Family History:   family history includes Breast cancer in her mother; Diabetes Mellitus II in her paternal aunt and paternal grandfather; Hypertension in her father.    Review of Systems: Review of Systems  Constitutional: Negative.   HENT: Negative.   Respiratory: Negative.   Cardiovascular: Negative.        Paroxysmal tachycardia related to positional changes  Gastrointestinal: Negative.   Musculoskeletal: Negative.   Neurological: Negative.   Psychiatric/Behavioral: Negative.   All other systems reviewed and are negative.    PHYSICAL EXAM: VS:  BP 116/72 (BP Location: Right Arm, Patient Position: Sitting, Cuff Size: Normal)   Pulse 64   Ht 5\' 4"  (1.626 m)   Wt 159 lb 4 oz (72.2 kg)   SpO2 98%   BMI 27.34 kg/m  , BMI Body mass index is 27.34 kg/m. GEN: Well nourished, well developed, in no acute distress HEENT: normal Neck: no JVD, carotid bruits, or masses Cardiac: RRR; no murmurs, rubs, or gallops,no edema  Respiratory:  clear to auscultation bilaterally, normal work of breathing GI: soft, nontender, nondistended, + BS MS: no deformity or atrophy Skin: warm and dry, no rash Neuro:  Strength and sensation are intact Psych: euthymic mood, full affect   Recent Labs: No results found for requested labs within last 8760 hours.    Lipid Panel No results found for: CHOL, HDL, LDLCALC, TRIG    Wt Readings  from Last 3 Encounters:  11/26/19 159 lb 4 oz (72.2 kg)  03/27/18 154 lb (69.9 kg)  12/06/17 130 lb (59 kg)      ASSESSMENT AND PLAN:  Problem List Items Addressed This Visit    None    Visit Diagnoses    POTS (postural orthostatic tachycardia syndrome)    -  Primary   Relevant Orders   EKG 12-Lead     Continues to have positional orthostatic tachycardia Symptoms have moderated on propranolol extended release 60 mg daily Recommend she take extra short acting propranolol 10 up to 20 mg 3 times daily as  needed for breakthrough tachycardia Discussed staying hydrated, Slight increase in weight over the past 2 years likely has helped with blood pressure At this time does not need Florinef.  Previous side effects on midodrine No further testing at this time  Information provided on setting up primary care   Disposition:   F/U  12 months   Total encounter time more than 45 minutes  Greater than 50% was spent in counseling and coordination of care with the patient    Signed, Dossie Arbour, M.D., Ph.D. [facility] [place], [place] [phone]

## 2019-11-26 ENCOUNTER — Encounter: Payer: Self-pay | Admitting: *Deleted

## 2019-11-26 ENCOUNTER — Ambulatory Visit (INDEPENDENT_AMBULATORY_CARE_PROVIDER_SITE_OTHER): Payer: 59 | Admitting: Cardiovascular Disease

## 2019-11-26 ENCOUNTER — Other Ambulatory Visit: Payer: Self-pay

## 2019-11-26 ENCOUNTER — Encounter: Payer: Self-pay | Admitting: Cardiovascular Disease

## 2019-11-26 VITALS — BP 116/72 | HR 64 | Ht 64.0 in | Wt 159.2 lb

## 2019-11-26 DIAGNOSIS — I498 Other specified cardiac arrhythmias: Secondary | ICD-10-CM | POA: Diagnosis not present

## 2019-11-26 DIAGNOSIS — G90A Postural orthostatic tachycardia syndrome (POTS): Secondary | ICD-10-CM

## 2019-11-26 MED ORDER — PROPRANOLOL HCL ER 60 MG PO CP24: 60.0000 mg | ORAL_CAPSULE | Freq: Every day | ORAL | 4 refills | Status: DC

## 2019-11-26 MED ORDER — PROPRANOLOL HCL 20 MG PO TABS: 20.0000 mg | ORAL_TABLET | Freq: Three times a day (TID) | ORAL | 1 refills | Status: DC | PRN

## 2019-11-26 NOTE — Patient Instructions (Signed)

## 2020-07-01 ENCOUNTER — Ambulatory Visit (INDEPENDENT_AMBULATORY_CARE_PROVIDER_SITE_OTHER): Payer: 59 | Admitting: Obstetrics

## 2020-07-01 ENCOUNTER — Encounter: Payer: Self-pay | Admitting: Obstetrics

## 2020-07-01 ENCOUNTER — Other Ambulatory Visit (HOSPITAL_COMMUNITY)
Admission: RE | Admit: 2020-07-01 | Discharge: 2020-07-01 | Disposition: A | Discharge status: Home / Self Care | Payer: 59 | Source: Ambulatory Visit | Attending: Obstetrics | Admitting: Obstetrics

## 2020-07-01 ENCOUNTER — Other Ambulatory Visit: Payer: Self-pay

## 2020-07-01 VITALS — BP 110/80 | HR 90 | Ht 65.0 in | Wt 156.0 lb

## 2020-07-01 DIAGNOSIS — Z01419 Encounter for gynecological examination (general) (routine) without abnormal findings: Secondary | ICD-10-CM | POA: Insufficient documentation

## 2020-07-01 DIAGNOSIS — Z124 Encounter for screening for malignant neoplasm of cervix: Secondary | ICD-10-CM | POA: Insufficient documentation

## 2020-07-01 DIAGNOSIS — I498 Other specified cardiac arrhythmias: Secondary | ICD-10-CM | POA: Diagnosis not present

## 2020-07-01 DIAGNOSIS — G90A Postural orthostatic tachycardia syndrome (POTS): Secondary | ICD-10-CM | POA: Insufficient documentation

## 2020-07-01 NOTE — Progress Notes (Signed)
Gynecology Annual Exam  PCP: Patient, No Pcp Per  Chief Complaint:  Chief Complaint  Patient presents with  . Gynecologic Exam    Bad odor after periods; trying to conceive for over a year    History of Present Illness: Denise Tucker is a 24 y.o. G0P0000 presents for annual exam. The patient has no complaints today. she has been married for three years,and over the last 3-4 months has been actively trying to get pregnant.  Her menses are regular, they occur every month, and they last 3 3 days. Her flow is moderate. She does not have intermenstrual bleeding. Her last menstrual period was 06/17/2020 . She denies dysmenorrhea. Last pap smear: 2018, results were NILM   The patient is  sexually active. She currently uses nothing for contraception.  She stopped taking OCPs  In 2020 intentionally. She does not have dyspareunia.  Since her last visit, she has had no significant changes in her health.  Her past medical history is remarkable for POTS for which she sees a Development worker, international aid on a regular basis.  The patient does perform self breast exams. Her last mammogram was NA  There is no family history of breast cancer. Her mother had Breast CA dx'ed inat age 50.enetic testing has not been done.   There is no family history of ovarian cancer. Genetic testing has not been done.  The patient denies smoking.  She reports drinking alcohol. She reports have rare drinks per week.   She denies illegal drug use.  The patient reports exercising regularly.  The patient denies current symptoms of depression.    Review of Systems: Review of Systems  Constitutional: Negative.   HENT: Negative.   Eyes: Negative.   Respiratory: Negative.   Cardiovascular: Negative.        Sees Cardiology regularly for her POTS  Gastrointestinal: Negative.   Genitourinary: Negative.   Musculoskeletal: Negative.   Skin: Negative.        Several tattoos on arm, back  Neurological: Negative.    Endo/Heme/Allergies: Negative.   Psychiatric/Behavioral: Negative.     Past Medical History:  Past Medical History:  Diagnosis Date  . Anxiety   . GERD (gastroesophageal reflux disease)   . POTS (postural orthostatic tachycardia syndrome)     Past Surgical History:  Past Surgical History:  Procedure Laterality Date  . NO PAST SURGERIES      Family History:  Family History  Problem Relation Age of Onset  . Breast cancer Mother   . Hypertension Father   . Diabetes Mellitus II Paternal Aunt   . Diabetes Mellitus II Paternal Grandfather     Social History:  Social History   Socioeconomic History  . Marital status: Married    Spouse name: Not on file  . Number of children: Not on file  . Years of education: Not on file  . Highest education level: Not on file  Occupational History  . Not on file  Tobacco Use  . Smoking status: Never Smoker  . Smokeless tobacco: Never Used  Vaping Use  . Vaping Use: Never used  Substance and Sexual Activity  . Alcohol use: Yes    Comment: rarely  . Drug use: No  . Sexual activity: Yes    Birth control/protection: None  Other Topics Concern  . Not on file  Social History Narrative  . Not on file   Social Determinants of Health   Financial Resource Strain: Not on file  Food Insecurity: Not  on file  Transportation Needs: Not on file  Physical Activity: Not on file  Stress: Not on file  Social Connections: Not on file  Intimate Partner Violence: Not on file    Allergies:  Allergies  Allergen Reactions  . Citrus     Medications: Prior to Admission medications   Medication Sig Start Date End Date Taking? Authorizing Provider  propranolol (INDERAL) 20 MG tablet Take 1 tablet (20 mg total) by mouth 3 (three) times daily as needed. 11/26/19  Yes Gollan, Tollie Pizza, MD  propranolol ER (INDERAL LA) 60 MG 24 hr capsule Take 1 capsule (60 mg total) by mouth daily. 11/26/19  Yes Gollan, Tollie Pizza, MD  pantoprazole (PROTONIX) 40  MG tablet Take 40 mg by mouth daily. Taking 1 tablet daily Patient not taking: Reported on 07/01/2020    [provider]    Physical Exam Vitals: Blood pressure 110/80, pulse 90, height 5\' 5"  (1.651 m), weight 156 lb (70.8 kg), last menstrual period 06/17/2020.  General: NAD HEENT: normocephalic, anicteric Neck: no thyroid enlargement, no palpable nodules, no cervical lymphadenopathy  Pulmonary: No increased work of breathing, CTAB Cardiovascular: RRR, without murmur  Breast: Breast symmetrical, no tenderness, no palpable nodules or masses, no skin or nipple retraction present, no nipple discharge.  No axillary, infraclavicular or supraclavicular lymphadenopathy. Abdomen: Soft, non-tender, non-distended.  Umbilicus without lesions.  No hepatomegaly or masses palpable. No evidence of hernia. Genitourinary:  External: Normal external female genitalia.  Normal urethral meatus, normal  Bartholin's and Skene's glands.    Vagina: Normal vaginal mucosa, no evidence of prolapse.    Cervix: Grossly normal in appearance, no bleeding, non-tender  Uterus: Anteverted, normal size, shape, and consistency, mobile, and non-tender  Adnexa: No adnexal masses, non-tender  Rectal: deferred  Lymphatic: no evidence of inguinal lymphadenopathy Extremities: no edema, erythema, or tenderness Neurologic: Grossly intact Psychiatric: mood appropriate, affect full     Assessment: 24 y.o. G0P0000  For annual Well Woman Exam   Plan:  Annual GYN physical with Pre conception counseling  1)  Annual PE completed.  2) STI screening was offered and declined.  3) Cervical cancer screening - Pap was done. ASCCP guidelines and rational discussed.  Patient opts for yearly screening interval  Pre conception counsel provided. She is advised to start on a daily vitamin with folic acid, and we discussed her looking up information on Fertility Awareness.  Her cycles are longer, so her fertile period will occur  later in a cycle than with 28 day cycles.  5) Routine healthcare maintenance including cholesterol and diabetes screening ordered today   22, CNM  07/01/2020 10:56 AM

## 2020-07-06 LAB — CYTOLOGY - PAP

## 2020-07-08 ENCOUNTER — Other Ambulatory Visit: Payer: Self-pay | Admitting: Obstetrics

## 2020-07-08 DIAGNOSIS — R87612 Low grade squamous intraepithelial lesion on cytologic smear of cervix (LGSIL): Secondary | ICD-10-CM

## 2020-07-08 NOTE — Progress Notes (Signed)
Pt contacted about her LGSIL pap smeqar result. Order placed for colpo appointment, and the patient was also called by phone and notified.  Mirna Mires, CNM  07/08/2020 9:11 AM

## 2020-08-04 ENCOUNTER — Other Ambulatory Visit: Payer: Self-pay

## 2020-08-04 ENCOUNTER — Encounter: Payer: Self-pay | Admitting: Obstetrics and Gynecology

## 2020-08-04 ENCOUNTER — Other Ambulatory Visit (HOSPITAL_COMMUNITY)
Admission: RE | Admit: 2020-08-04 | Discharge: 2020-08-04 | Disposition: A | Discharge status: Home / Self Care | Payer: 59 | Source: Ambulatory Visit | Attending: Obstetrics and Gynecology | Admitting: Obstetrics and Gynecology

## 2020-08-04 ENCOUNTER — Ambulatory Visit (INDEPENDENT_AMBULATORY_CARE_PROVIDER_SITE_OTHER): Payer: 59 | Admitting: Obstetrics and Gynecology

## 2020-08-04 VITALS — BP 118/74 | Ht 64.0 in

## 2020-08-04 DIAGNOSIS — R87612 Low grade squamous intraepithelial lesion on cytologic smear of cervix (LGSIL): Secondary | ICD-10-CM | POA: Diagnosis present

## 2020-08-04 NOTE — Progress Notes (Signed)
HPI:  Denise Tucker is a 25 y.o.  G0P0000  who presents today for evaluation and management of abnormal cervical cytology.    Dysplasia History:  LGSIL pap smear on 07/01/2020  OB History  Gravida Para Term Preterm AB Living  0 0 0 0 0 0  SAB IAB Ectopic Multiple Live Births  0 0 0 0 0    Past Medical History:  Diagnosis Date  . Anxiety   . GERD (gastroesophageal reflux disease)   . POTS (postural orthostatic tachycardia syndrome)     Past Surgical History:  Procedure Laterality Date  . NO PAST SURGERIES      SOCIAL HISTORY:  Social History   Substance and Sexual Activity  Alcohol Use Yes   Comment: rarely    Social History   Substance and Sexual Activity  Drug Use No     Family History  Problem Relation Age of Onset  . Breast cancer Mother   . Hypertension Father   . Diabetes Mellitus II Paternal Aunt   . Diabetes Mellitus II Paternal Grandfather     ALLERGIES:  Citrus  Current Outpatient Medications on File Prior to Visit  Medication Sig Dispense Refill  . pantoprazole (PROTONIX) 40 MG tablet Take 40 mg by mouth daily. Taking 1 tablet daily    . propranolol (INDERAL) 20 MG tablet Take 1 tablet (20 mg total) by mouth 3 (three) times daily as needed. 90 tablet 1  . propranolol ER (INDERAL LA) 60 MG 24 hr capsule Take 1 capsule (60 mg total) by mouth daily. 90 capsule 4   No current facility-administered medications on file prior to visit.    Physical Exam: -Vitals:  BP 118/74   Ht 5\' 4"  (1.626 m)   LMP 07/18/2020   BMI 26.78 kg/m  GEN: WD, WN, NAD.  A+ O x 3, good mood and affect. ABD:  NT, ND.  Soft, no masses.  No hernias noted.   Pelvic:   Vulva: Normal appearance.  No lesions.  Vagina: No lesions or abnormalities noted.  Support: Normal pelvic support.  Urethra No masses tenderness or scarring.  Meatus Normal size without lesions or prolapse.  Cervix: See below.  Anus: Normal exam.  No lesions.  Perineum: Normal exam.  No lesions.         Bimanual   Uterus: Normal size.  Non-tender.  Mobile.  AV.  Adnexae: No masses.  Non-tender to palpation.  Cul-de-sac: Negative for abnormality.   PROCEDURE: 1.  Urine Pregnancy Test:  not done 2.  Colposcopy performed with 4% acetic acid after verbal consent obtained                                         -Aceto-white Lesions Location(s): mildly diffusely.              -Biopsy performed at 4, 8, and 12 o'clock               -ECC indicated and performed: No.     -Biopsy sites made hemostatic with pressure, AgNO3, and/or Monsel's solution   -Satisfactory colposcopy: Yes.      -Evidence of Invasive cervical CA :  NO  ASSESSMENT:  Denise Tucker is a 25 y.o. G0P0000 here for  1. LGSIL on Pap smear of cervix   .  PLAN: I discussed the grading system of pap smears and HPV high risk viral  types.  We will discuss and base management after colpo results return.     Thomasene Mohair, MD  [facility], [facility] 08/04/2020  10:29 AM

## 2020-08-05 LAB — SURGICAL PATHOLOGY

## 2020-08-06 ENCOUNTER — Telehealth: Payer: Self-pay | Admitting: Obstetrics and Gynecology

## 2020-08-06 NOTE — Telephone Encounter (Signed)
Discussed LGSIL (CIN 1) results. Recommend pap smear in one year. All questions answered.

## 2020-12-18 ENCOUNTER — Other Ambulatory Visit: Payer: Self-pay

## 2020-12-18 ENCOUNTER — Other Ambulatory Visit: Payer: Self-pay | Admitting: Cardiovascular Disease

## 2020-12-18 MED ORDER — PROPRANOLOL HCL ER 60 MG PO CP24: 60.0000 mg | ORAL_CAPSULE | Freq: Every day | ORAL | 0 refills | Status: DC

## 2020-12-18 MED ORDER — PROPRANOLOL HCL 20 MG PO TABS: 20.0000 mg | ORAL_TABLET | Freq: Three times a day (TID) | ORAL | 0 refills | Status: DC | PRN

## 2020-12-18 NOTE — Telephone Encounter (Signed)
Pt overdue for 12 month f/u hasn't been seen in over a year. Pt needing refills. Please contact pt for future appointment.

## 2020-12-18 NOTE — Telephone Encounter (Signed)
Patient scheduled 06/17 Patient only has a small amount left if we would be able to send some in before appt

## 2020-12-26 ENCOUNTER — Ambulatory Visit: Payer: Commercial Managed Care - PPO | Admitting: Physician Assistant

## 2020-12-26 ENCOUNTER — Encounter: Payer: Self-pay | Admitting: Physician Assistant

## 2020-12-26 ENCOUNTER — Other Ambulatory Visit: Payer: Self-pay

## 2020-12-26 VITALS — BP 104/70 | HR 77 | Ht 64.0 in | Wt 168.4 lb

## 2020-12-26 DIAGNOSIS — G90A Postural orthostatic tachycardia syndrome (POTS): Secondary | ICD-10-CM

## 2020-12-26 DIAGNOSIS — I498 Other specified cardiac arrhythmias: Secondary | ICD-10-CM

## 2020-12-26 NOTE — Patient Instructions (Signed)
Medication Instructions:  No changes at this time.  *If you need a refill on your cardiac medications before your next appointment, please call your pharmacy*   Lab Work: None  If you have labs (blood work) drawn today and your tests are completely normal, you will receive your results only by: MyChart Message (if you have MyChart) OR A paper copy in the mail If you have any lab test that is abnormal or we need to change your treatment, we will call you to review the results.   Testing/Procedures: None   Follow-Up: At [facility], you and your health needs are our priority.  As part of our continuing mission to provide you with exceptional heart care, we have created designated Provider Care Teams.  These Care Teams include your primary Cardiologist (physician) and Advanced Practice Providers (APPs -  Physician Assistants and Nurse Practitioners) who all work together to provide you with the care you need, when you need it.  Your next appointment:   1 year(s)  The format for your next appointment:   In Person  Provider:   You may see Dr. Dossie Arbour or one of the following Advanced Practice Providers on your designated Care Team:   Nicolasa Ducking, NP Eula Listen, PA-C Marisue Ivan, PA-C Cadence Pendleton, [place] Gillian Shields, NP

## 2020-12-26 NOTE — Progress Notes (Signed)
Office Visit    Patient Name: Denise Tucker Date of Encounter: 12/29/2020  PCP:  Patient, No Pcp Per (Inactive)   [facility]  Cardiologist:  Dr. Mariah Milling Advanced Practice Provider:  No care team member to display Electrophysiologist:  None :[id number]}   Chief Complaint    Chief Complaint  Patient presents with   Annual Exam    Pt states she is doing well.    25 y.o. female with history of POTS and here today for 1 year follow-up with reports she is doing well on her propanolol but questions regarding continuing this medication during pregnancy with recommendations as below.  Past Medical History    Past Medical History:  Diagnosis Date   Anxiety    GERD (gastroesophageal reflux disease)    POTS (postural orthostatic tachycardia syndrome)    Past Surgical History:  Procedure Laterality Date   NO PAST SURGERIES      Allergies  Allergies  Allergen Reactions   Citrus     History of Present Illness    Denise Tucker is a 25 y.o. female with PMH as above.  She has h/o POTS.  Previously seen at [facility] in 2019 and diagnosed with POTS after tilt table test.  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  She presented 11/26/2019 for new patient evaluation in the setting of tachycardia and near syncope.  She continued to have positional tachycardia on propanolol ER 60 mg for over 1 year.  She reported significant improvement in her rates with propanolol.  Without propanolol, standing heart rate elevated up to 120 to 150 bpm and at rest was 90 to 120 bpm.  At rest during clinic, heart rate was 60 to 70s on propanolol.  She denied syncope over the last year.  It was noted she was previously on midodrine 10 mg 3 times daily but reported itching and not attempted Florinef.  EKG was NSR without significant ST/T changes.  Recommendation was for as needed propanolol 10 mg up to 20 mg 3 times daily for  breakthrough tachycardia.  Hydration was encouraged.  It was noted that she had a slight increase in weight over the last 2 years, likely helping her BP.  Follow-up in 1 year was recommended.  Today, 12/26/2020, she returns to clinic and notes that she has been doing well over the last year.  On her propanolol, she notices her heart races less often.  She feels the best she has in many years.  She reports some palpitations but not frequently.  She usually takes her additional propanolol if she knows she is about to go hiking or do something that usually triggers her tachypalpitations.  She denies any presyncope with exertion unless hiking for an hour or 2 in the heat, at which time she will feel weak or woozy and stop/hydrate with recovery after that time.  She is getting regular physical activity and walking every day for an hour.  No reported symptoms with her usual walking each day.  No chest pain or shortness of breath.  With moderate activity, she is not dizzy.  She works in a Surveyor, mining and has not passed out or had any presyncope, which she finds reassuring.  She reports slow position changes, which have helped with her previous symptoms of orthostasis.  She stays well-hydrated and is consistently drinking water.  She has trivial edema resolving with elevation. She still  notes weight gain, attributed to beta-blocker, but is not concerned regarding this finding, as she has been satisfied with the medication.  No chest pain or shortness of breath at rest or with exertion.  No abdominal distention, PND, orthopnea, or early satiety.  No signs or symptoms of bleeding.  She reports that she and her husband are trying to have a baby and asks if she can continue propanolol with recommendation as below to avoid propanolol during pregnancy and breast-feeding.  BP soft today with patient report that she is asymptomatic with lower pressures.  She is no longer taking her PPI and no longer reports symptoms consistent with  reflux.  Home Medications   Current Outpatient Medications  Medication Instructions   propranolol (INDERAL) 20 mg, Oral, 3 times daily PRN   propranolol ER (INDERAL LA) 60 mg, Oral, Daily     Review of Systems    She reports feeling better than she has in years. She still notes occasional tachypalpitations, resolving with as needed propanolol.  She reports weight gain, attributed to her beta-blocker, and not concerning to her.  She reports trivial edema, resolving with elevation.  She has dizziness with extreme exertion and resolving with rest.  Resolution of previous orthostatic dizziness.  Resolution of previous reflux.  She denies chest pain, shortness of breath, pnd, orthopnea, n, v, syncope,or early satiety.   All other systems reviewed and are otherwise negative except as noted above.  Physical Exam    VS:  BP 104/70   Pulse 77   Ht 5\' 4"  (1.626 m)   Wt 168 lb 6.4 oz (76.4 kg)   BMI 28.91 kg/m  , BMI Body mass index is 28.91 kg/m. GEN: Well nourished, well developed, in no acute distress. HEENT: normal. Neck: Supple, no JVD, carotid bruits, or masses. Cardiac: RRR, no murmurs, rubs, or gallops. No clubbing, cyanosis, or pitting edema.  Radials/DP/PT 2+ and equal bilaterally.  Respiratory:  Respirations regular and unlabored, clear to auscultation bilaterally. GI: Soft, nontender, nondistended, BS + x 4. MS: no deformity or atrophy. Skin: warm and dry, no rash. Neuro:  Strength and sensation are intact. Psych: Normal affect.  Accessory Clinical Findings    ECG personally reviewed by me today -NSR, 77 bpm- no acute changes.  VITALS Reviewed today   Temp Readings from Last 3 Encounters:  03/13/19 98.5 F (36.9 C) (Oral)  12/06/17 98.7 F (37.1 C) (Oral)   BP Readings from Last 3 Encounters:  12/26/20 104/70  08/04/20 118/74  07/01/20 110/80   Pulse Readings from Last 3 Encounters:  12/26/20 77  07/01/20 90  11/26/19 64    Wt Readings from Last 3 Encounters:   12/26/20 168 lb 6.4 oz (76.4 kg)  07/01/20 156 lb (70.8 kg)  11/26/19 159 lb 4 oz (72.2 kg)     LABS  reviewed today   Lab Results  Component Value Date   WBC 11.5 (H) 12/06/2017   HGB 14.3 12/06/2017   HCT 40.8 12/06/2017   MCV 91.1 12/06/2017   PLT 307 12/06/2017   Lab Results  Component Value Date   CREATININE 0.71 12/06/2017   BUN 16 12/06/2017   NA 135 12/06/2017   K 3.8 12/06/2017   CL 106 12/06/2017   CO2 22 12/06/2017   Lab Results  Component Value Date   ALT 23 12/06/2017   AST 23 12/06/2017   ALKPHOS 57 12/06/2017   BILITOT 0.8 12/06/2017   No results found for: CHOL, HDL, LDLCALC, LDLDIRECT, TRIG, CHOLHDL  No results found for: HGBA1C Lab Results  Component Value Date   TSH 2.069 12/06/2017     STUDIES/PROCEDURES reviewed today   None  Assessment & Plan    Positional orthostatic tachycardia syndrome --Reports she has been doing better than she has in years.  She only occasionally has symptoms of tachypalpitations.  Orthostasis symptoms have resolved with slow position changes and hydration.  She reports rare dizziness if overexerting herself.  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  She has continued on propanolol extended release 60 mg daily with short acting propanolol 10 to 20 mg up to 3 times daily for breakthrough tachycardia.  She asks about continuing this medication with pregnancy with recommendation  that she avoid propanolol during pregnancy and breast-feeding.  Alternative solutions to be discussed with team and MyChart message sent to update patient if further recommendations.  Her slight increase in weight has likely helped with her POTS and it was discussed that it is possible pregnancy and corresponding hormonal changes could also mean less orthostasis and tachypalpitations.  Continue hydration and slow position changes.  Medication changes: None.  Avoid propanolol during pregnancy and breast-feeding. Labs  ordered: None Studies / Imaging ordered: None Future considerations: Additional recommendations regarding POTS and pregnancy after discussion with team/pharmacy Disposition: RTC 1 year  *Please be aware that the above documentation was completed voice recognition software and may contain dictation errors.      Lennon Alstrom, PA-C 12/29/2020

## 2020-12-29 ENCOUNTER — Encounter: Payer: Self-pay | Admitting: Physician Assistant

## 2021-03-12 ENCOUNTER — Ambulatory Visit (INDEPENDENT_AMBULATORY_CARE_PROVIDER_SITE_OTHER): Payer: Commercial Managed Care - PPO | Admitting: Obstetrics and Gynecology

## 2021-03-12 ENCOUNTER — Encounter: Payer: Self-pay | Admitting: Obstetrics and Gynecology

## 2021-03-12 ENCOUNTER — Other Ambulatory Visit: Payer: Self-pay

## 2021-03-12 VITALS — BP 122/70 | Ht 66.0 in | Wt 165.0 lb

## 2021-03-12 DIAGNOSIS — N979 Female infertility, unspecified: Secondary | ICD-10-CM

## 2021-03-12 NOTE — Progress Notes (Signed)
Obstetrics & Gynecology Office Visit   Chief Complaint  Patient presents with   Infertility   History of Present Illness:  Patient is a 25 y.o. G0P0000 presenting for evaluation of infertility. Patient and partner have been attempting conception for well over a year. Marital Status: married. Pregnancies with current partner: 0  Sexual History Dyspareunia: no Use of Lubricant: occasionally Douching: no  Ovulatory Evaluation LMP: Patient's last menstrual period was 03/07/2021. Menarche:13 - 25 yo Shortest Interval: 32  Longest Interval: 34  days Duration of flow: 3 days, very light, then spotting Heavy Menses: no Clots: no Ovulation Predictor Kits: yes.  Intermenstrual Bleeding: no Dysmenorrhea: no Amenorrhea: no Wt Change: no Hirsutism: no Balding: no Acne: no Galactorrhea: no Hot Flashes: no Meds: propranolol Other Therapies: no  Tubal Factor Previous abdominal or pelvic surgery: no Pelvic Pain:  no Endometriosis: no STD: chlamydia, >5 years ago, treated PID: no Laparoscopy: no Prior HSG: no  Female Factor Sired prior conception:  no Semen analysis: no  Contraception: minipill for about 3 years  Contributing Habits Cigarettes:    Wife -  no    Husband - no Alcohol:    Wife -  rare    Husband - rare Marijuana: never  Past Medical History:  Diagnosis Date   Anxiety    GERD (gastroesophageal reflux disease)    POTS (postural orthostatic tachycardia syndrome)     Past Surgical History:  Procedure Laterality Date   NO PAST SURGERIES      Gynecologic History: Patient's last menstrual period was 03/07/2021.  Obstetric History: G0P0000  Family History  Problem Relation Age of Onset   Breast cancer Mother    Hypertension Father    Diabetes Mellitus II Paternal Aunt    Diabetes Mellitus II Paternal Grandfather    Social History   Socioeconomic History   Marital status: Married    Spouse name: Not on file   Number of children: Not on file    Years of education: Not on file   Highest education level: Not on file  Occupational History   Not on file  Tobacco Use   Smoking status: Never   Smokeless tobacco: Never  Vaping Use   Vaping Use: Never used  Substance and Sexual Activity   Alcohol use: Yes    Comment: rarely   Drug use: No   Sexual activity: Yes    Birth control/protection: None  Other Topics Concern   Not on file  Social History Narrative   Not on file   Social Determinants of Health   Financial Resource Strain: Not on file  Food Insecurity: Not on file  Transportation Needs: Not on file  Physical Activity: Not on file  Stress: Not on file  Social Connections: Not on file  Intimate Partner Violence: Not on file    Allergies  Allergen Reactions   Citrus     Prior to Admission medications   Medication Sig Start Date End Date Taking? Authorizing Provider  propranolol (INDERAL) 20 MG tablet Take 1 tablet (20 mg total) by mouth 3 (three) times daily as needed. 12/18/20   Antonieta Iba, MD  propranolol ER (INDERAL LA) 60 MG 24 hr capsule Take 1 capsule (60 mg total) by mouth daily. 12/18/20   Antonieta Iba, MD    Review of Systems  Constitutional: Negative.   HENT: Negative.    Eyes: Negative.   Respiratory: Negative.    Cardiovascular: Negative.   Gastrointestinal: Negative.   Genitourinary: Negative.  Musculoskeletal: Negative.   Skin: Negative.   Neurological: Negative.   Psychiatric/Behavioral: Negative.      Physical Exam BP 122/70   Ht 5\' 6"  (1.676 m)   Wt 165 lb (74.8 kg)   LMP 03/07/2021   BMI 26.63 kg/m  Patient's last menstrual period was 03/07/2021. Physical Exam Constitutional:      General: She is not in acute distress.    Appearance: Normal appearance.  HENT:     Head: Normocephalic and atraumatic.  Eyes:     General: No scleral icterus.    Conjunctiva/sclera: Conjunctivae normal.  Neurological:     General: No focal deficit present.     Mental Status: She is  alert and oriented to person, place, and time.     Cranial Nerves: No cranial nerve deficit.  Psychiatric:        Mood and Affect: Mood normal.        Behavior: Behavior normal.        Judgment: Judgment normal.   Pelvic exam done previously.   Female chaperone present for pelvic and breast  portions of the physical exam  Assessment: 25 y.o. G0P0000 female here for  1. Infertility, female      Plan: Problem List Items Addressed This Visit   None Visit Diagnoses     Infertility, female    -  Primary   Relevant Orders   Progesterone      We discussed the underlying etiologies which may be implicated in a couple experiencing difficulty conceiving.  The average couple will conceive within the span of 1 year with unprotected coitus, with a monthly fecundity rate of 20% or 1 in 5.  Even without further work up or intervention the patient and her partner may be successful in conceiving unassisted, although if an underlying etiology can be identified and addressed fecundity rate may improve.  The work up entails examining for ovulatory function, tubal patency, and ruling out female factor infertility.  These may be looked at concurrently or sequentially.  The downside of sequential work up is that this method may miss issues if more than one compartment is contributing.  She is aware that tubal factor or moderate to severe female factor infertility will require further consultation with a reproductive endocrinologist.  In the case of anovulation, use of Clomid (clomiphen citrate) or Femara (letrazole) were discussed with the understanding the the later is an off-label, but well supported use.  Current recommendation are to use letrozole first line secondary to increased live birth rate compared to clomid for patients with PCOS ("Polycystic Ovarian Syndrome" ACOG Practice Bulletin 194 June 2018).  With either of these drugs the risk of multiples increases from the standard population rate of 2% to  approximately 10%, with higher order multiples possible but unlikely.  Both drugs may require some time to titrate to the appropriate dosage to ensure consistent ovulation.  Cycles will be limited to 6 cycles on each drug secondary to decreasing rates of conception after 6 cycles.  In addition should patient be started on ovulation induction with Clomid she was advised to discontinue the drug for any vision changes as this is a rare but potentially permanent side-effect if medication is continued.  Was counseled that letrozole may cause idiopathic bone pain that generally resolves.  Hot flashes, headaches, and nausea were mentioned as the most commonly encountered side-effects of both drugs.  We discussed timing of intercourse as well as the use of ovulation predictor kits identify the patient's fertile  window each month.    A progesterone level was ordered today for the mid portion of her luteal phase, assuming an average cycle length of 33 days.  A semen analysis was ordered for her husband, as this may take some time to obtain.  The timing of an HSG for this month would be very difficult.  So, may consider for next month, depending on results from the progesterone test.  A total of 32 minutes were spent face-to-face with the patient as well as preparation, review, communication, and documentation during this encounter.    Thomasene Mohair, MD 03/12/2021 4:29 PM

## 2021-03-17 ENCOUNTER — Encounter: Payer: Self-pay | Admitting: Obstetrics and Gynecology

## 2021-03-18 ENCOUNTER — Other Ambulatory Visit: Payer: Self-pay

## 2021-03-18 MED ORDER — PROPRANOLOL HCL ER 60 MG PO CP24: 60.0000 mg | ORAL_CAPSULE | Freq: Every day | ORAL | 3 refills | Status: DC

## 2021-04-01 ENCOUNTER — Other Ambulatory Visit: Payer: Self-pay

## 2021-04-01 ENCOUNTER — Other Ambulatory Visit: Payer: Commercial Managed Care - PPO

## 2021-04-01 DIAGNOSIS — N979 Female infertility, unspecified: Secondary | ICD-10-CM

## 2021-04-02 LAB — PROGESTERONE: Progesterone: 3.8 ng/mL

## 2021-04-07 ENCOUNTER — Other Ambulatory Visit: Payer: Self-pay | Admitting: Obstetrics and Gynecology

## 2021-04-07 DIAGNOSIS — N979 Female infertility, unspecified: Secondary | ICD-10-CM

## 2021-04-14 ENCOUNTER — Ambulatory Visit
Admission: RE | Admit: 2021-04-14 | Discharge: 2021-04-14 | Disposition: A | Discharge status: Home / Self Care | Payer: Commercial Managed Care - PPO | Source: Ambulatory Visit | Attending: Obstetrics and Gynecology | Admitting: Obstetrics and Gynecology

## 2021-04-14 ENCOUNTER — Other Ambulatory Visit: Payer: Self-pay

## 2021-04-14 DIAGNOSIS — N979 Female infertility, unspecified: Secondary | ICD-10-CM | POA: Diagnosis not present

## 2021-04-14 MED ORDER — IOHEXOL 300 MG/ML  SOLN
50.0000 mL | Freq: Once | INTRAMUSCULAR | Status: AC | PRN
  Administered 2021-04-14: 10 mL

## 2021-04-14 NOTE — Procedures (Signed)
PROCEDURE DETAILS:   With patient in supine lithotomy position, prep and drape done.  Cervix normal.  Catheter slowly advanced into uterine cavity. Approximately 10 mL Omipaque 300 dye injected under fluoroscopic visualization.  Uterine contour appears normal with patent bilateral fallopian tubes seen.  Catheter removed.  Pt stable, tolerated procedure well.   Thomasene Mohair, MD, [facility], [facility] 04/14/2021 3:04 PM

## 2021-12-14 ENCOUNTER — Other Ambulatory Visit (HOSPITAL_COMMUNITY)
Admission: RE | Admit: 2021-12-14 | Discharge: 2021-12-14 | Disposition: A | Discharge status: Home / Self Care | Payer: Commercial Managed Care - PPO | Source: Ambulatory Visit | Attending: Family Medicine | Admitting: Family Medicine

## 2021-12-14 ENCOUNTER — Encounter: Payer: Self-pay | Admitting: Family Medicine

## 2021-12-14 ENCOUNTER — Ambulatory Visit: Payer: Commercial Managed Care - PPO | Admitting: Family Medicine

## 2021-12-14 VITALS — BP 126/80 | Ht 65.0 in | Wt 168.0 lb

## 2021-12-14 DIAGNOSIS — Z319 Encounter for procreative management, unspecified: Secondary | ICD-10-CM | POA: Insufficient documentation

## 2021-12-14 DIAGNOSIS — Z124 Encounter for screening for malignant neoplasm of cervix: Secondary | ICD-10-CM | POA: Insufficient documentation

## 2021-12-14 DIAGNOSIS — Z3169 Encounter for other general counseling and advice on procreation: Secondary | ICD-10-CM

## 2021-12-14 MED ORDER — LETROZOLE 2.5 MG PO TABS: 2.5000 mg | ORAL_TABLET | Freq: Every day | ORAL | 6 refills | Status: DC

## 2021-12-14 NOTE — Progress Notes (Unsigned)
h  GYNECOLOGY PROBLEM  VISIT ENCOUNTER NOTE  Subjective:   Denise Tucker is a 26 y.o. G0P0000 female here for a problem GYN visit.  Current complaints: desires pregnancy and has been trying for 2 years.     Cycle length is 29-40 days, typically 30-35day.  Menses last about 3 days.  Trying for 2 yrs to achieve pregnancy Having unprotected sex every other day Partner has not father other children and has not had a sperm count. Partner has suspected varicoele and report painful area in testes. Has pcp visit today and I recommend urology  They used ovulation prediction kits in the beginning of 2021 but have not used consistently.  No family members with similar sx.  Patient has had normal HSG   Denies abnormal vaginal bleeding, discharge, pelvic pain, problems with intercourse or other gynecologic concerns.    Gynecologic History Patient's last menstrual period was 12/07/2021 (exact date).  Contraception: none  Health Maintenance Due  Topic Date Due   HIV Screening  Never done   Hepatitis C Screening  Never done   TETANUS/TDAP  Never done   COVID-19 Vaccine (3 - Booster for Pfizer series) 01/15/2020    The following portions of the patient's history were reviewed and updated as appropriate: allergies, current medications, past family history, past medical history, past social history, past surgical history and problem list.  Review of Systems Pertinent items are noted in HPI.   Objective:  BP 126/80   Ht 5\' 5"  (1.651 m)   Wt 168 lb (76.2 kg)   LMP 12/07/2021 (Exact Date)   BMI 27.96 kg/m   Gen: well appearing, NAD HEENT: no scleral icterus CV: RR Lung: Normal WOB Ext: warm well perfused  PELVIC: Normal appearing external genitalia; normal appearing vaginal mucosa and cervix.  No abnormal discharge noted.  Pap smear obtained.  Normal uterine size, no other palpable masses, no uterine or adnexal tenderness.   Assessment and Plan:   1. Encounter for preconception  consultation Patient is planning on conceiving. We reviewed her current problems and medications in terms of pregnancy safety. Additionally discussed fertility awareness, when to take a pregnancy test, and starting a prenatal vitamin. We discussed general early pregnancy precautions. Reviewed services at the office regarding prenatal care. She voiced understanding.  Problem specific recommendations are: Difficulty achieving pregnancy after 2 years.  Given cycle length suspect luteal phase defect and irregular ovulation.  Discussed baseline work up per below and use of letrazole to help stimulate ovulation.   - ordered semen analysis for partner through his chart.  - TSH + free T4 - Hemoglobin A1c - Anti mullerian hormone - Cytology - PAP - letrozole (FEMARA) 2.5 MG tablet; Take 1 tablet (2.5 mg total) by mouth daily. Take on days 3 to 7 following a spontaneous menses or progestin-induced bleed.  Dispense: 5 tablet; Refill: 6  2. Patient desires pregnancy - TSH + free T4 - Hemoglobin A1c - Anti mullerian hormone - Cytology - PAP - letrozole (FEMARA) 2.5 MG tablet; Take 1 tablet (2.5 mg total) by mouth daily. Take on days 3 to 7 following a spontaneous menses or progestin-induced bleed.  Dispense: 5 tablet; Refill: 6  3. Cervical cancer screening - Cytology - PAP  Please refer to After Visit Summary for other counseling recommendations.   Return in about 3 months (around 03/16/2022) for fertility.  Caren Macadam, MD, MPH, ABFM Attending [place] for [facility]

## 2021-12-19 LAB — HEMOGLOBIN A1C
Est. average glucose Bld gHb Est-mCnc: 100 mg/dL
Hgb A1c MFr Bld: 5.1 % (ref 4.8–5.6)

## 2021-12-19 LAB — TSH+FREE T4
Free T4: 1.33 ng/dL (ref 0.82–1.77)
TSH: 1.64 u[IU]/mL (ref 0.450–4.500)

## 2021-12-19 LAB — ANTI MULLERIAN HORMONE: ANTI-MULLERIAN HORMONE (AMH): 7.35 ng/mL

## 2021-12-21 ENCOUNTER — Encounter: Payer: Self-pay | Admitting: Family Medicine

## 2021-12-21 LAB — CYTOLOGY - PAP
Comment: NEGATIVE
Diagnosis: HIGH — AB
High risk HPV: NEGATIVE

## 2021-12-23 ENCOUNTER — Telehealth: Payer: Self-pay | Admitting: Obstetrics and Gynecology

## 2021-12-23 NOTE — Telephone Encounter (Signed)
Called pt to schedule colpo with Dr. Logan Bores.  He has a potential appt on July at 8:15 or 11:00.  Left message for pt to call back.

## 2021-12-24 NOTE — Telephone Encounter (Signed)
Called and left voicemail for patient to call back to be scheduled. 

## 2021-12-29 NOTE — Telephone Encounter (Signed)
Called and left voicemail for patient to call back to be scheduled. 

## 2022-01-05 ENCOUNTER — Encounter: Payer: Self-pay | Admitting: Family Medicine

## 2022-03-03 ENCOUNTER — Encounter: Payer: Self-pay | Admitting: Cardiovascular Disease

## 2022-03-03 NOTE — Telephone Encounter (Signed)
Error

## 2022-03-11 ENCOUNTER — Other Ambulatory Visit: Payer: Self-pay | Admitting: Cardiovascular Disease

## 2022-03-12 ENCOUNTER — Encounter: Payer: Self-pay | Admitting: Nurse Practitioner

## 2022-03-12 ENCOUNTER — Ambulatory Visit: Payer: Commercial Managed Care - PPO | Attending: Nurse Practitioner | Admitting: Cardiology

## 2022-03-12 VITALS — BP 110/64 | HR 77 | Ht 65.0 in | Wt 169.6 lb

## 2022-03-12 DIAGNOSIS — G90A Postural orthostatic tachycardia syndrome (POTS): Secondary | ICD-10-CM

## 2022-03-12 MED ORDER — PROPRANOLOL HCL ER 60 MG PO CP24: 60.0000 mg | ORAL_CAPSULE | Freq: Every day | ORAL | 3 refills | Status: DC

## 2022-03-12 NOTE — Patient Instructions (Signed)
Medication Instructions:  Your Physician recommend you continue on your current medication as directed.    *If you need a refill on your cardiac medications before your next appointment, please call your pharmacy*   Lab Work: None ordered today   Testing/Procedures: None ordered today   Follow-Up: At [facility], you and your health needs are our priority.  As part of our continuing mission to provide you with exceptional heart care, we have created designated Provider Care Teams.  These Care Teams include your primary Cardiologist (physician) and Advanced Practice Providers (APPs -  Physician Assistants and Nurse Practitioners) who all work together to provide you with the care you need, when you need it.  We recommend signing up for the patient portal called "MyChart".  Sign up information is provided on this After Visit Summary.  MyChart is used to connect with patients for Virtual Visits (Telemedicine).  Patients are able to view lab/test results, encounter notes, upcoming appointments, etc.  Non-urgent messages can be sent to your provider as well.   To learn more about what you can do with MyChart, go to [email or web address].    Your next appointment:   1 year(s)  The format for your next appointment:   In Person  Provider:   Julien Nordmann, MD

## 2022-03-12 NOTE — Progress Notes (Signed)
Cardiology Clinic Note   Patient Name: Denise Tucker Date of Encounter: 03/12/2022  Primary Care Provider:  Patient, No Pcp Per Primary Cardiologist:  Julien Nordmann, MD  Patient Profile    26 year old female with a past medical history of POTS, anxiety, and gastroesophageal reflux disease.  Postural orthostatic tachycardia syndrome caused tachycardia and near syncope/syncopal episodes.  She is here today for routine follow-up.  Past Medical History    Past Medical History:  Diagnosis Date   Anxiety    GERD (gastroesophageal reflux disease)    POTS (postural orthostatic tachycardia syndrome)    Past Surgical History:  Procedure Laterality Date   NO PAST SURGERIES      Allergies  Allergies  Allergen Reactions   Citrus     History of Present Illness    26 year old female with a past medical history of POTS, anxiety, and gastroesophageal reflux disease.  She was previously evaluated [facility] in 2019 and diagnosed with POTS after tilt table testing.  She had suffered from of course tachycardia, near syncope/syncope and was managed on propanolol.  She presented 11/26/2019 as a new patient for evaluation of the tachycardia and near syncope.  She continued to have postural tachycardia propanolol ER 60 mg for 1 year.  She reported significant improvement in her rates with propanolol.  Without propanolol, standing heart rates were elevated to 1 2150 bpm and at rest she averaged 90 to 120 bpm.  On evaluation on propanolol at rest heart rate was 60 to 70 bpm.  She had not had any previous syncope over the last year.  She did note previously she was on midodrine 10 mg 3 times daily but reported itching and had not attempted Florinef.  EKG was normal sinus rhythm without significant ST/T changes.  Recommendation was for needed propanolol 10 mg up to 20 mg 3 times daily for breakthrough tachycardia.  She was educated on hydration.  She was last seen in clinic 12/26/2020 and was reporting  palpitations but not frequently.  She was taking additional propanolol if she was going to do any activity that triggered her tachypalpitations.  She had denied any presyncope with exertion and less hiking for an hour or 2 in the heat.  At which time she would feel weak and woozy.  She would stop and hydrate with recovery shortly thereafter.  She denies any hospitalizations or visits to the emergency department.  Blood pressure was soft during exam she was asymptomatic.  She returns to clinic today stating that she has been doing well. She denies any chest pain, shortness of breath, palpitations, or near syncope/syncope events. 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 Has only taken PRN propranolol a few times over the last year. Denies any hospitalizations or visits to the emergency department.   Home Medications    Current Outpatient Medications  Medication Sig Dispense Refill   letrozole (FEMARA) 2.5 MG tablet Take 1 tablet (2.5 mg total) by mouth daily. Take on days 3 to 7 following a spontaneous menses or progestin-induced bleed. 5 tablet 6   propranolol (INDERAL) 20 MG tablet Take 1 tablet (20 mg total) by mouth 3 (three) times daily as needed. 90 tablet 0   propranolol ER (INDERAL LA) 60 MG 24 hr capsule Take 1 capsule (60 mg total) by mouth daily. 90 capsule 3   No current facility-administered medications for this visit.     Family History    Family History  Problem Relation Age of  Onset   Breast cancer Mother    Hypertension Father    Diabetes Mellitus II Paternal Aunt    Diabetes Mellitus II Paternal Grandfather    She indicated that her mother is alive. She indicated that her father is alive. She indicated that the status of her paternal grandfather is unknown. She indicated that the status of her paternal aunt is unknown.  Social History    Social History   Socioeconomic History   Marital status: Married    Spouse name: Not on file   Number of children: Not  on file   Years of education: Not on file   Highest education level: Not on file  Occupational History   Not on file  Tobacco Use   Smoking status: Never   Smokeless tobacco: Never  Vaping Use   Vaping Use: Never used  Substance and Sexual Activity   Alcohol use: Yes    Comment: rarely   Drug use: No   Sexual activity: Yes    Birth control/protection: None  Other Topics Concern   Not on file  Social History Narrative   Not on file   Social Determinants of Health   Financial Resource Strain: Not on file  Food Insecurity: Not on file  Transportation Needs: Not on file  Physical Activity: Not on file  Stress: Not on file  Social Connections: Not on file  Intimate Partner Violence: Not on file     Review of Systems    General:  No chills, fever, night sweats or weight changes.  Cardiovascular:  No chest pain, dyspnea on exertion, edema, orthopnea, palpitations, paroxysmal nocturnal dyspnea. Dermatological: No rash, lesions/masses Respiratory: No cough, dyspnea Urologic: No hematuria, dysuria Abdominal:   No nausea, vomiting, diarrhea, bright red blood per rectum, melena, or hematemesis Neurologic:  No visual changes, wkns, changes in mental status. All other systems reviewed and are otherwise negative except as noted above.     Physical Exam    VS:  BP 110/64 (BP Location: Left Arm, Patient Position: Sitting, Cuff Size: Normal)   Pulse 77   Ht 5\' 5"  (1.651 m)   Wt 169 lb 9.6 oz (76.9 kg)   SpO2 99%   BMI 28.22 kg/m  , BMI Body mass index is 28.22 kg/m.     GEN: Well nourished, well developed, in no acute distress. HEENT: normal. Neck: Supple, no JVD, carotid bruits, or masses. Cardiac: RRR, no murmurs, rubs, or gallops. No clubbing, cyanosis, edema.  Radials/DP/PT 2+ and equal bilaterally.  Respiratory:  Respirations regular and unlabored, clear to auscultation bilaterally. GI: Soft, nontender, nondistended, BS + x 4. MS: no deformity or atrophy. Skin: warm  and dry, no rash. Neuro:  Strength and sensation are intact. Psych: Normal affect.  Accessory Clinical Findings    ECG personally reviewed by me today- sinus rate of 77 - No acute changes  Lab Results  Component Value Date   WBC 11.5 (H) 12/06/2017   HGB 14.3 12/06/2017   HCT 40.8 12/06/2017   MCV 91.1 12/06/2017   PLT 307 12/06/2017   Lab Results  Component Value Date   CREATININE 0.71 12/06/2017   BUN 16 12/06/2017   NA 135 12/06/2017   K 3.8 12/06/2017   CL 106 12/06/2017   CO2 22 12/06/2017   Lab Results  Component Value Date   ALT 23 12/06/2017   AST 23 12/06/2017   ALKPHOS 57 12/06/2017   BILITOT 0.8 12/06/2017   No results found for: "CHOL", "HDL", "LDLCALC", "  LDLDIRECT", "TRIG", "CHOLHDL"  Lab Results  Component Value Date   HGBA1C 5.1 12/14/2021    Assessment & Plan   1.  Positional orthostatic tachycardia syndrome she reports that she has been doing much better.  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  She continues to maintain her hydration, avoiding excess alcohol, and trying to stay active.  She has only required the use of breakthrough propranolol a few times in the past year.  She continues to take Inderal LA 60 mg daily and requires refill of that medication today for 90 days sent to the pharmacy of choice.  She has also been continued on propanolol 20 mg 3 times a day as needed for symptoms.  She has been encouraged to continue with hydration and slow position changes as well and was investing in some compression stockings to wear while she is at work.  2.  Disposition patient return to clinic to see MD/APP in 1 year or sooner if needed  Drevion Offord, NP 03/12/2022, 1:51 PM

## 2022-04-28 ENCOUNTER — Other Ambulatory Visit: Payer: Self-pay | Admitting: *Deleted

## 2022-04-28 MED ORDER — PROPRANOLOL HCL 20 MG PO TABS: 20.0000 mg | ORAL_TABLET | Freq: Three times a day (TID) | ORAL | 3 refills | Status: DC | PRN

## 2022-08-27 ENCOUNTER — Other Ambulatory Visit: Payer: Self-pay | Admitting: Family Medicine

## 2022-08-27 DIAGNOSIS — Z3169 Encounter for other general counseling and advice on procreation: Secondary | ICD-10-CM

## 2022-08-27 DIAGNOSIS — Z319 Encounter for procreative management, unspecified: Secondary | ICD-10-CM

## 2022-12-04 IMAGING — RF DG HYSTEROGRAM
4 series · 10 of 10 positions shown · IV contrast (omnipaque)
Comparison: None.

CLINICAL DATA: Infertility

EXAM:
HYSTEROSALPINGOGRAM
TECHNIQUE: Following cleansing of the cervix and vagina with Betadine solution,
a hysterosalpingogram was performed using a 5-French
hysterosalpingogram catheter and Omnipaque 300 contrast. The patient
tolerated the examination without difficulty.

[Series 1: cp_standard · 0.26mm/px · 1 of 1 slices shown (1 of 4)]
[im 1/1]
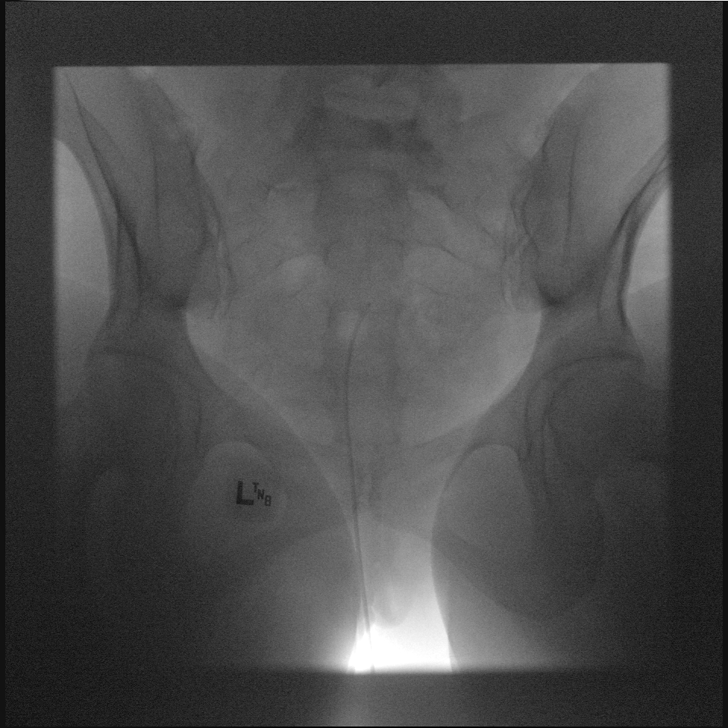

[Series 2: cp_standard · 0.26mm/px · 4 of 245 frames shown (2 of 4)]
[frame 37/245]
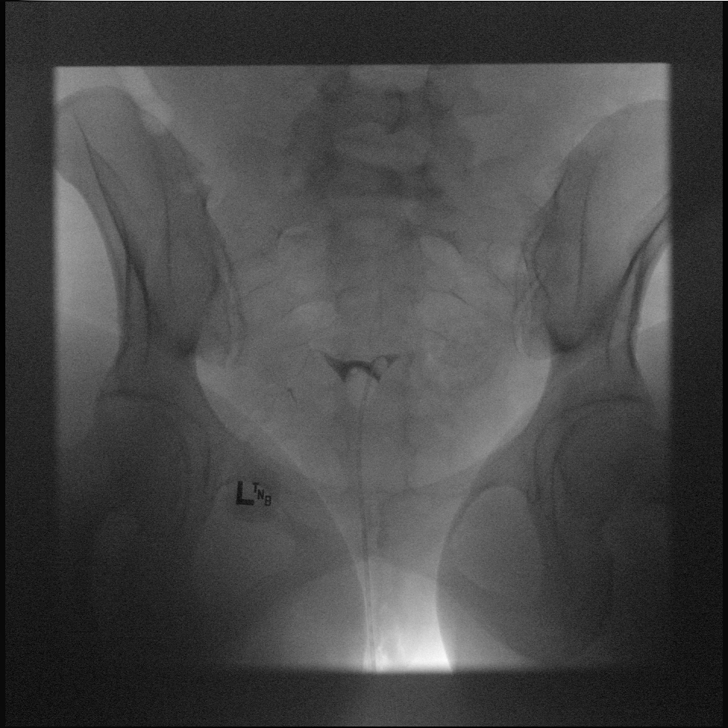
[frame 123/245]
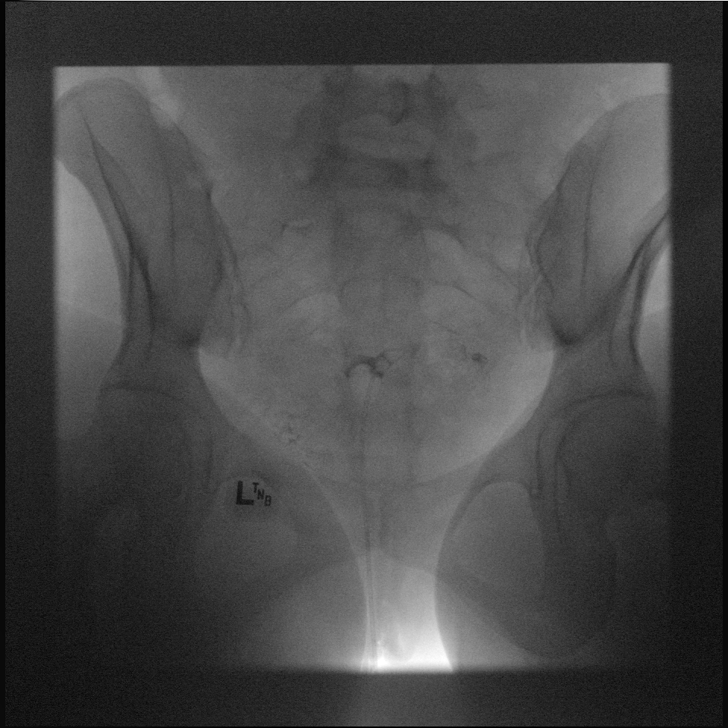
[frame 140/245]
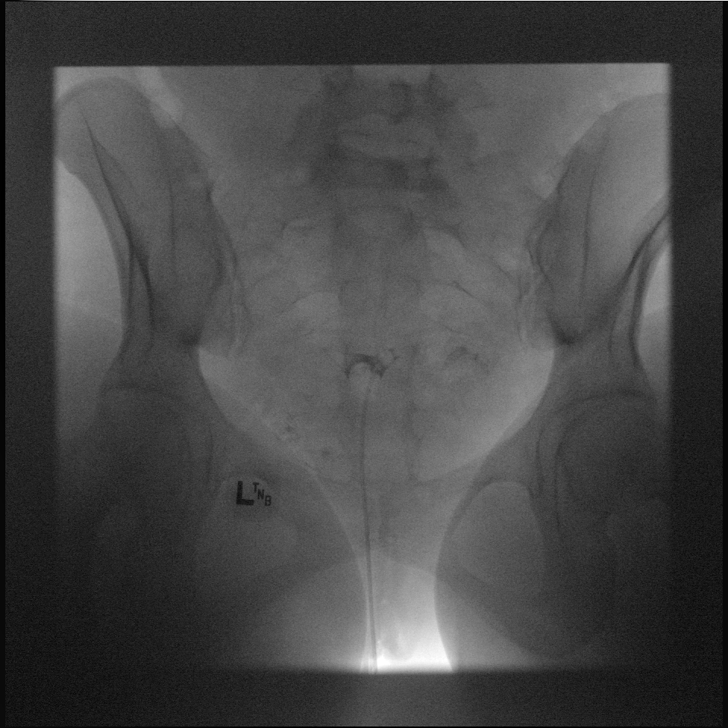
[frame 209/245]
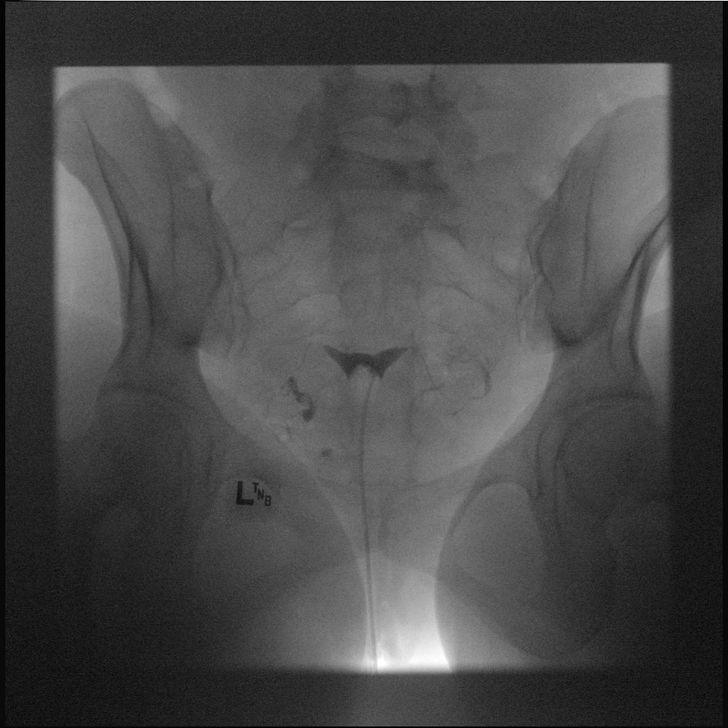

[Series 3: cp_standard · 0.26mm/px · 1 of 1 slices shown (3 of 4)]
[im 1/1]
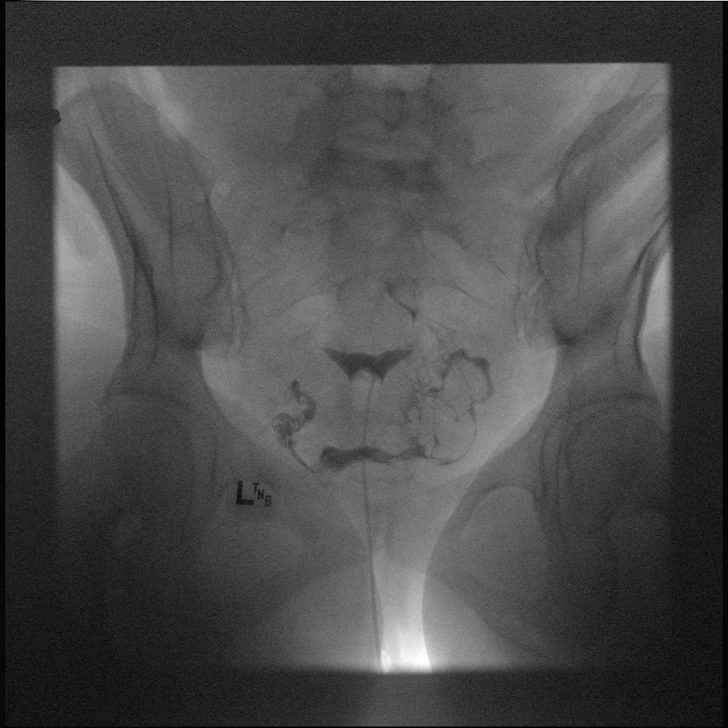

[Series 4: cp_standard · 0.26mm/px · 4 of 80 frames shown (4 of 4)]
[frame 7/80]
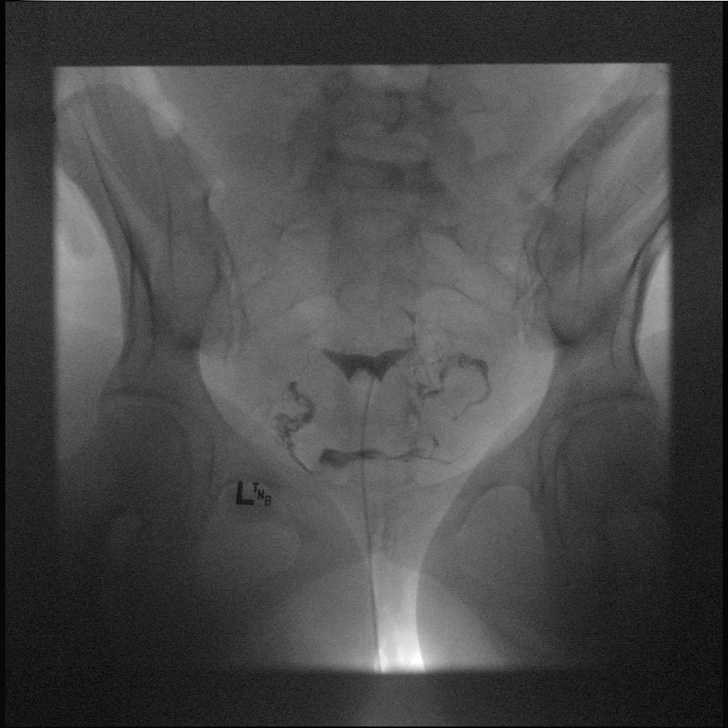
[frame 13/80]
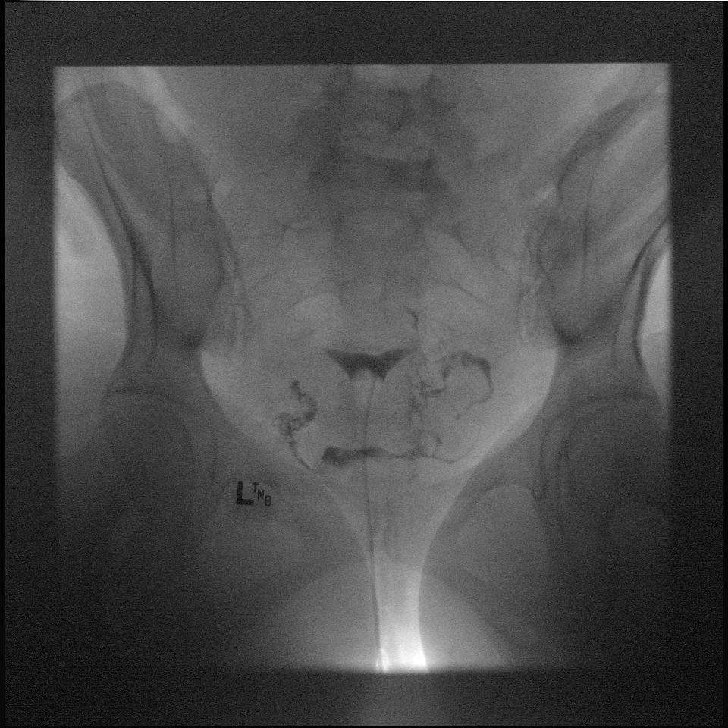
[frame 41/80]
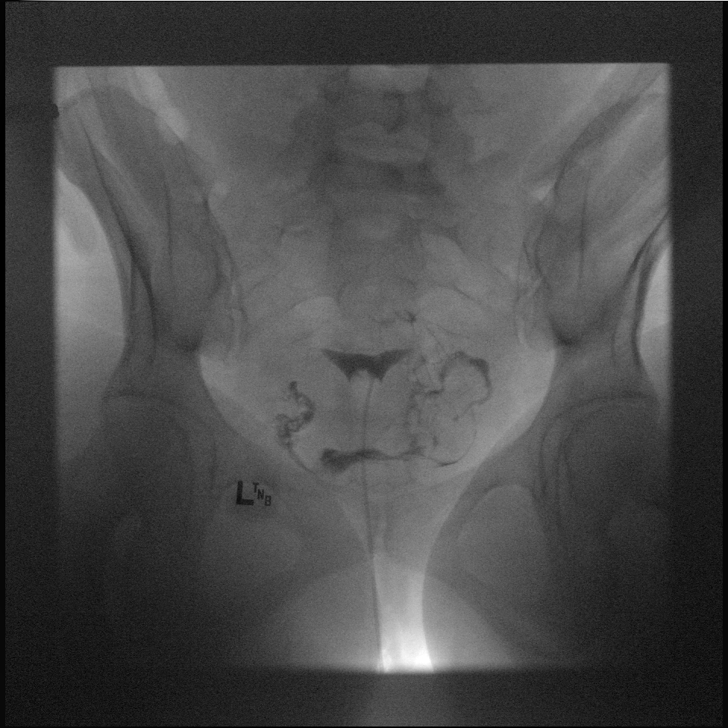
[frame 69/80]
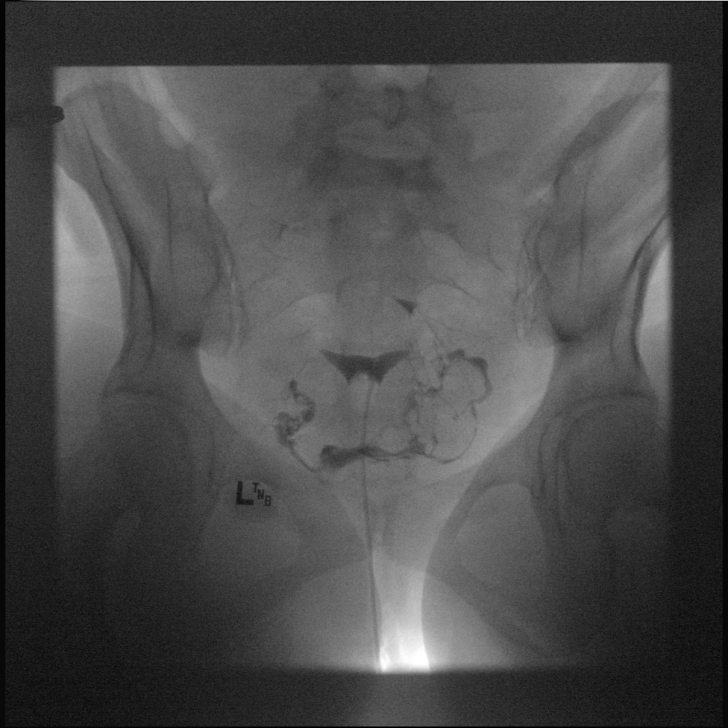

[10 of 10 positions shown; findings below may reference images not displayed]

FLUOROSCOPY TIME:  Radiation Exposure Index (as provided by the
fluoroscopic device): 48 seconds

If the device does not provide the exposure index:

Fluoroscopy Time:  8.9 mGy

Number of Acquired Images:  2 images in addition to 2 cine clips
FINDINGS: Endometrial cavity is normal. Both fallopian tubes fill and have a
normal appearance. Normal spillage bilaterally.
IMPRESSION: Normal hysterosalpingogram.

## 2023-03-18 ENCOUNTER — Ambulatory Visit (INDEPENDENT_AMBULATORY_CARE_PROVIDER_SITE_OTHER): Payer: Commercial Managed Care - PPO | Admitting: Licensed Practical Nurse

## 2023-03-18 ENCOUNTER — Encounter: Payer: Self-pay | Admitting: Licensed Practical Nurse

## 2023-03-18 VITALS — BP 116/78 | HR 71 | Wt 169.5 lb

## 2023-03-18 DIAGNOSIS — F32A Depression, unspecified: Secondary | ICD-10-CM | POA: Insufficient documentation

## 2023-03-18 DIAGNOSIS — Z131 Encounter for screening for diabetes mellitus: Secondary | ICD-10-CM

## 2023-03-18 DIAGNOSIS — N979 Female infertility, unspecified: Secondary | ICD-10-CM

## 2023-03-18 DIAGNOSIS — F3289 Other specified depressive episodes: Secondary | ICD-10-CM

## 2023-03-18 DIAGNOSIS — Z01419 Encounter for gynecological examination (general) (routine) without abnormal findings: Secondary | ICD-10-CM | POA: Diagnosis not present

## 2023-03-18 DIAGNOSIS — Z13220 Encounter for screening for lipoid disorders: Secondary | ICD-10-CM

## 2023-03-18 NOTE — Progress Notes (Signed)
Gynecology Annual Exam   PCP: Patient, No Pcp Per  Chief Complaint:  Chief Complaint  Patient presents with   Gynecologic Exam    History of Present Illness: Patient is a 27 y.o. G0P0000 presents for annual exam. The patient would like to discuss her inability to become pregnant. She and her partner have been trying for the last 2-3 years. For the most part they have been having regular timed IC. Her SIL Meriam Sprague) passed away, since then they both have been struggling with depression.  -Serephina had initial labs done in 2023, AMH, TSH and progesterone all WNL -She had an HAG which was normal -She has tried Letrozole -She is not using [facility], but has used them in the past, right now she uses an app -she gets a little cramp on her side when she thinks she is ovulating -Since January her cycles have become irregular  -He had an evaluation has a low sperm count, had a varicocele   LMP: Patient's last menstrual period was 02/26/2023 (exact date). Average Interval: irregular,  32-45  days Duration of flow: 2 days Heavy Menses: no Clots: no Intermenstrual Bleeding: no Postcoital Bleeding: no Dysmenorrhea: no  The patient is sexually active. She currently uses none for contraception. She denies dyspareunia.  The patient does perform self breast exams.  There is notable family history of breast or ovarian cancer in her family.  The patient wears seatbelts: yes.   The patient has regular exercise: no.    The patient reports current symptoms of depression.  She and her partner are struggling wit the loss of [place]. She has considered therapy, but has not yet gone. She does not want to use medication.   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 Contraception: none Last Pap: Results were:  ASC-H HPV negative  Has not scheduled COLPO out of concern of wanting  become pregnant   Obstetric History: G0P0000  Family History:  Family History  Problem Relation Age of Onset   Breast cancer Mother    Hypertension Father    Diabetes Mellitus II Paternal Aunt    Diabetes Mellitus II Paternal Grandfather     Social History:  Social History   Socioeconomic History   Marital status: Married    Spouse name: Not on file   Number of children: Not on file   Years of education: Not on file   Highest education level: Not on file  Occupational History   Not on file  Tobacco Use   Smoking status: Never   Smokeless tobacco: Never  Vaping Use   Vaping status: Never Used  Substance and Sexual Activity   Alcohol use: Yes    Comment: rarely   Drug use: No   Sexual activity: Yes    Birth control/protection: None  Other Topics Concern   Not on file  Social History Narrative   Not on file   Social Determinants of  Health   Financial Resource Strain: Not on file  Food Insecurity: Not on file  Transportation Needs: Not on file  Physical Activity: Not on file  Stress: Not on file  Social Connections: Not on file  Intimate Partner Violence: Not on file    Allergies:  Allergies  Allergen Reactions   Citrus     Medications: Prior to Admission medications   Medication Sig Start Date End Date Taking? Authorizing Provider  propranolol (INDERAL) 20 MG tablet Take 1 tablet (20 mg total) by mouth 3 (three) times daily as needed. 04/28/22  Yes Gollan, Tollie Pizza, MD  propranolol ER (INDERAL LA) 60 MG 24 hr capsule Take 1 capsule (60 mg total) by mouth daily.  03/12/22  Yes Hammock, Lavonna Rua, NP  letrozole (FEMARA) 2.5 MG tablet Take 1 tablet (2.5 mg total) by mouth daily. Take on days 3 to 7 following a spontaneous menses or progestin-induced bleed. Patient not taking: Reported on 03/18/2023 12/14/21   Federico Flake, MD    Physical Exam Vitals: Blood pressure 116/78, pulse 71, weight 169 lb 8 oz (76.9 kg), last menstrual period 02/26/2023.  General: NAD HEENT: normocephalic, anicteric Thyroid: no enlargement, no palpable nodules Pulmonary: No increased work of breathing, CTAB Cardiovascular: RRR, distal pulses 2+ Breast: Breast symmetrical, no tenderness, no palpable nodules or masses, no skin or nipple retraction present, no nipple discharge.  No axillary or supraclavicular lymphadenopathy. Abdomen: NABS, soft, non-tender, non-distended.  Umbilicus without lesions.  No hepatomegaly, splenomegaly or masses palpable. No evidence of hernia  Genitourinary:  External: Normal external female genitalia.  Normal urethral meatus, normal Bartholin's and Skene's glands.    Vagina: Normal vaginal mucosa, no evidence of prolapse.    Cervix: exam deferred  Uterus: Non-enlarged, mobile, normal contour.  No CMT, good tone   Adnexa: ovaries non-enlarged, no adnexal masses  Rectal: deferred  Lymphatic: no evidence of inguinal lymphadenopathy Extremities: no edema, erythema, or tenderness Neurologic: Grossly intact Psychiatric: mood appropriate, affect full   Assessment: 27 y.o. G0P0000 routine annual exam  Plan: Problem List Items Addressed This Visit       Other   Depression   Other Visit Diagnoses     Well woman exam    -  Primary   Relevant Orders   Hemoglobin A1c   Lipid panel   Ambulatory referral to Obstetrics / Gynecology   Screening for diabetes mellitus       Relevant Orders   Hemoglobin A1c   Screening for cholesterol level       Relevant Orders   Lipid panel   Infertility, female       Relevant Orders   Ambulatory referral to  Obstetrics / Gynecology       2) STI screening  wasoffered and declined  2)  ASCCP guidelines and rational discussed.  Needs COLPO ASAP   3) Contraception - the patient is currently using  none.  She is attempting to conceive in the near future  4) Routine healthcare maintenance including cholesterol, diabetes screening discussed To return fasting at a later date  5) Infertility: referral to REI made   6) depression: encouraged pt to increase physical activity and seek counseling, please let me know if you need a referral    Carie Caddy, CNM   [facility] 03/18/2023, 2:31 PM

## 2023-04-13 ENCOUNTER — Other Ambulatory Visit: Payer: Self-pay | Admitting: Nurse Practitioner

## 2023-04-13 NOTE — Telephone Encounter (Signed)
Please contact pt for future appointment. Pt due for 12 month f/u. 

## 2023-04-13 NOTE — Telephone Encounter (Signed)
Appt scheduled on 10/9.

## 2023-04-19 NOTE — Progress Notes (Unsigned)
Cardiology Office Note  Date:  04/20/2023   ID:  Denise Tucker, DOB October 14, 1995, MRN [id number]  PCP:  Patient, No Pcp Per   Chief Complaint  Patient presents with   Follow-up    12 mo f/u. pt feels well. Medications reviewed verbally.    HPI:  [name] is a 27 year old woman with past medical history of POTS seen [facility] in 2019 and was diagnosed with POTS after a tilt table test. Who presents for follow-up of her tachycardia, near syncope/syncope  Last seen by myself in clinic May 2021 Seen by one of our providers September 2023  Reports having a difficult year Last several family members Currently in the process of moving into a new house Some difficulty getting to sleep  Occasional symptoms of Facial flushing, headache, light hurts Periodically will take NSAIDs  Reports doing well on long-acting propranolol ER 60 daily Past year, rare use of short acting proprnaolol Not working out as much Still some dizziness, sits when she has symptoms, hydrates  EKG personally reviewed by myself on todays visit EKG Interpretation Date/Time:  Wednesday April 20 2023 10:27:39 EDT Ventricular Rate:  72 PR Interval:  138 QRS Duration:  86 QT Interval:  392 QTC Calculation: 429 R Axis:   50  Text Interpretation: Normal sinus rhythm Normal ECG When compared with ECG of 13-Mar-2019 12:24, Nonspecific T wave abnormality now evident in Inferior leads Confirmed by Julien Nordmann [phone]) on 04/20/2023 10:43:09 AM    Prior work-up discussed, Additional testing performed at [facility] several years ago Records reviewed, positive tilt table study She has had some mild weight gain over the past several years with improvement of her near syncope and syncope  Has continued to have positional tachycardia On propranolol ER 60  >1 year Heart rate dramatically improved, symptoms moderated Without propranolol, with standing heart rate up to 120 to 150 At rest, 90 to 120 At rest today heart  rates 60s to 70 on propranolol  No syncope in over 1 year Previously on midodrine , had itching, was on midorine 10 mg three times a day Has not tried Florinef  PMH:   has a past medical history of Anxiety, GERD (gastroesophageal reflux disease), and POTS (postural orthostatic tachycardia syndrome).  PSH:    Past Surgical History:  Procedure Laterality Date   NO PAST SURGERIES      Current Outpatient Medications  Medication Sig Dispense Refill   propranolol (INDERAL) 20 MG tablet Take 1 tablet (20 mg total) by mouth 3 (three) times daily as needed. 90 tablet 3   propranolol ER (INDERAL LA) 60 MG 24 hr capsule TAKE 1 CAPSULE(60 MG) BY MOUTH DAILY 90 capsule 0   No current facility-administered medications for this visit.    Allergies:   Citrus   Social History:  The patient  reports that she has never smoked. She has never used smokeless tobacco. She reports current alcohol use. She reports that she does not use drugs.   Family History:   family history includes Breast cancer in her mother; Diabetes Mellitus II in her paternal aunt and paternal grandfather; Hypertension in her father.    Review of Systems: Review of Systems  Constitutional: Negative.   HENT: Negative.    Respiratory: Negative.    Cardiovascular: Negative.   Gastrointestinal: Negative.   Musculoskeletal: Negative.   Neurological: Negative.   Psychiatric/Behavioral: Negative.    All other systems reviewed and are negative.   PHYSICAL EXAM: VS:  BP 118/70 (BP Location: Left  Arm, Patient Position: Sitting, Cuff Size: Normal)   Pulse 72   Ht 5\' 5"  (1.651 m)   Wt 165 lb (74.8 kg)   SpO2 97%   BMI 27.46 kg/m  , BMI Body mass index is 27.46 kg/m. GEN: Well nourished, well developed, in no acute distress HEENT: normal Neck: no JVD, carotid bruits, or masses Cardiac: RRR; no murmurs, rubs, or gallops,no edema  Respiratory:  clear to auscultation bilaterally, normal work of breathing GI: soft, nontender,  nondistended, + BS MS: no deformity or atrophy Skin: warm and dry, no rash Neuro:  Strength and sensation are intact Psych: euthymic mood, full affect   Recent Labs: No results found for requested labs within last 365 days.    Lipid Panel No results found for: "CHOL", "HDL", "LDLCALC", "TRIG"    Wt Readings from Last 3 Encounters:  04/20/23 165 lb (74.8 kg)  03/18/23 169 lb 8 oz (76.9 kg)  03/12/22 169 lb 9.6 oz (76.9 kg)      ASSESSMENT AND PLAN:  Problem List Items Addressed This Visit       Cardiology Problems   POTS (postural orthostatic tachycardia syndrome) - Primary   Relevant Orders   EKG 12-Lead (Completed)    positional orthostatic tachycardia Symptoms relatively well-controlled with propranolol ER 60 daily Rare use of short acting propranolol Stays hydrated No indication for fludrocortisone or midodrine  Previous side effects on midodrine No further testing needed    Total encounter time more than 30 minutes  Greater than 50% was spent in counseling and coordination of care with the patient    Signed, Dossie Arbour, M.D., Ph.D. [facility] [place], [place] [phone]

## 2023-04-20 ENCOUNTER — Encounter: Payer: Self-pay | Admitting: Cardiovascular Disease

## 2023-04-20 ENCOUNTER — Ambulatory Visit: Payer: Commercial Managed Care - PPO | Attending: Cardiovascular Disease | Admitting: Cardiovascular Disease

## 2023-04-20 VITALS — BP 118/70 | HR 72 | Ht 65.0 in | Wt 165.0 lb

## 2023-04-20 DIAGNOSIS — G90A Postural orthostatic tachycardia syndrome (POTS): Secondary | ICD-10-CM

## 2023-04-20 MED ORDER — PROPRANOLOL HCL 20 MG PO TABS: 20.0000 mg | ORAL_TABLET | Freq: Three times a day (TID) | ORAL | 3 refills | Status: AC | PRN

## 2023-04-20 MED ORDER — PROPRANOLOL HCL ER 60 MG PO CP24: 60.0000 mg | ORAL_CAPSULE | Freq: Every day | ORAL | 4 refills | Status: DC

## 2023-04-20 NOTE — Patient Instructions (Signed)

## 2024-03-23 ENCOUNTER — Ambulatory Visit: Admitting: Obstetrics

## 2024-03-23 ENCOUNTER — Encounter: Payer: Self-pay | Admitting: Obstetrics

## 2024-03-23 ENCOUNTER — Other Ambulatory Visit (HOSPITAL_COMMUNITY)
Admission: RE | Admit: 2024-03-23 | Discharge: 2024-03-23 | Disposition: A | Discharge status: Home / Self Care | Source: Ambulatory Visit | Attending: Obstetrics | Admitting: Obstetrics

## 2024-03-23 VITALS — BP 115/77 | HR 74 | Ht 66.0 in | Wt 171.5 lb

## 2024-03-23 DIAGNOSIS — Z3169 Encounter for other general counseling and advice on procreation: Secondary | ICD-10-CM

## 2024-03-23 DIAGNOSIS — Z01419 Encounter for gynecological examination (general) (routine) without abnormal findings: Secondary | ICD-10-CM | POA: Diagnosis not present

## 2024-03-23 DIAGNOSIS — Z124 Encounter for screening for malignant neoplasm of cervix: Secondary | ICD-10-CM | POA: Insufficient documentation

## 2024-03-23 DIAGNOSIS — N978 Female infertility of other origin: Secondary | ICD-10-CM | POA: Diagnosis not present

## 2024-03-23 NOTE — Progress Notes (Signed)
 ANNUAL GYNECOLOGICAL EXAM  SUBJECTIVE  HPI  Denise Tucker is a 28 y.o.-year-old G0P0000 who presents for an annual gynecological exam today.  She denies pelvic pain, dyspareunia, abnormal vaginal bleeding or discharge, and UTI symptoms. Her periods come about every 32-40 days. She has been tracking ovulation, and it occurs consistently at 14 days prior to menses. She has had a normal HSG and infertility labs, and has tried a course of letrozole . Her partner has a low sperm count. She had a referral to REI from her last visit but was not able to arrange an appointment.   Medical/Surgical History Past Medical History:  Diagnosis Date   Anxiety    GERD (gastroesophageal reflux disease)    POTS (postural orthostatic tachycardia syndrome)    Past Surgical History:  Procedure Laterality Date   NO PAST SURGERIES      Social History Lives with husband. Feels safe there Work: Surveyor, mining at [employer] Exercise: work Substances: Occasional EtOH; denies tobacco, vape, and recreational drugs  Obstetric History OB History     Gravida  0   Para  0   Term  0   Preterm  0   AB  0   Living  0      SAB  0   IAB  0   Ectopic  0   Multiple  0   Live Births  0            GYN/Menstrual History Patient's last menstrual period was 03/23/2024. Periods q 32-40 days Last Pap: 12/14/21- ASC-HS, negative HPV Contraception: TTC  Prevention Mammogram: at 39 Colonoscopy: at 44 Flu shot/vaccines  Current Medications Outpatient Medications Prior to Visit  Medication Sig   propranolol  (INDERAL ) 20 MG tablet Take 1 tablet (20 mg total) by mouth 3 (three) times daily as needed.   propranolol  ER (INDERAL  LA) 60 MG 24 hr capsule Take 1 capsule (60 mg total) by mouth daily.   No facility-administered medications prior to visit.      [facility] - 03/23/24 1335       Pregnancy Intention Screening   Does the patient want to become pregnant in the next year? Yes    Does the  patient's partner want to become pregnant in the next year? Yes    Would the patient like to discuss contraceptive options today? No      Contraception Wrap Up   Current Method No Contraceptive Precautions;Pregnant/Seeking Pregnancy    End Method No Contraception Precautions    Contraception Counseling Provided No    How was the end contraceptive method provided? N/A           ROS Constitutional: Denied constitutional symptoms, night sweats, recent illness, fatigue, fever, insomnia and weight loss.  Eyes: Denied eye symptoms, eye pain, photophobia, vision change and visual disturbance.  Ears/Nose/Throat/Neck: Denied ear, nose, throat or neck symptoms, hearing loss, nasal discharge, sinus congestion and sore throat.  Cardiovascular: Denied cardiovascular symptoms, arrhythmia, chest pain/pressure, edema, exercise intolerance, orthopnea and palpitations.  Respiratory: Denied pulmonary symptoms, asthma, pleuritic pain, productive sputum, cough, dyspnea and wheezing.  Gastrointestinal: Denied gastro-esophageal reflux, melena, nausea and vomiting.  Genitourinary: Denied genitourinary symptoms including symptomatic vaginal discharge, pelvic relaxation issues, and urinary complaints.  Musculoskeletal: Denied musculoskeletal symptoms, stiffness, swelling, muscle weakness and myalgia.  Dermatologic: Denied dermatology symptoms, rash and scar.  Neurologic: Denied neurology symptoms, dizziness, headache, neck pain and syncope.  Psychiatric: Denied psychiatric symptoms, anxiety and depression.  Endocrine: Denied endocrine symptoms including hot flashes and night  sweats.    OBJECTIVE  BP 115/77   Pulse 74   Ht 5' 6 (1.676 m)   Wt 171 lb 8 oz (77.8 kg)   LMP 03/23/2024   BMI 27.68 kg/m    Physical examination General NAD, Conversant  HEENT Atraumatic; Op clear with mmm.  Normo-cephalic. Pupils reactive. Anicteric sclerae  Thyroid /Neck Smooth without nodularity or enlargement. Normal ROM.   Neck Supple.  Skin No rashes, lesions or ulceration. Normal palpated skin turgor. No nodularity.  Breasts: No masses or discharge.  Symmetric.  No axillary adenopathy.  Lungs: Clear to auscultation.No rales or wheezes. Normal Respiratory effort, no retractions.  Heart: NSR.  No murmurs or rubs appreciated. No peripheral edema  Abdomen: Soft.  Non-tender.  No masses.  No HSM. No hernia  Extremities: Moves all appropriately.  Normal ROM for age. No lymphadenopathy.  Neuro: Oriented to PPT.  Normal mood. Normal affect.     Pelvic:   Vulva: Normal appearance.  No lesions.  Vagina: No lesions or abnormalities noted.  Support: Normal pelvic support.  Urethra No masses tenderness or scarring.  Meatus Normal size without lesions or prolapse.  Cervix: Normal appearance.  No lesions.  Perineum: Normal exam.  No lesions. Pap collected.    ASSESSMENT  1) Annual exam 2) Due for Pap (colpo recommended based on past results) 2) Infertility - female factor  PLAN 1) Physical exam as noted. Discussed healthy lifestyle choices and preventive care. Declines STI testing and routine labs. 2) Pap collected. F/u based on results. Strongly recommend scheduling colpo if indicated. 3) Referral sent to REI  Return in one year for annual exam or as needed for concerns.   Yarelin Reichardt, CNM

## 2024-03-28 ENCOUNTER — Ambulatory Visit: Payer: Self-pay | Admitting: Obstetrics

## 2024-03-28 LAB — CYTOLOGY - PAP
Comment: NEGATIVE
Diagnosis: NEGATIVE
High risk HPV: NEGATIVE

## 2024-05-13 ENCOUNTER — Encounter: Payer: Self-pay | Admitting: Licensed Practical Nurse

## 2024-05-16 DIAGNOSIS — Z3687 Encounter for antenatal screening for uncertain dates: Secondary | ICD-10-CM | POA: Insufficient documentation

## 2024-05-16 NOTE — Patient Instructions (Incomplete)
 SABRA

## 2024-05-16 NOTE — Progress Notes (Unsigned)
    NURSE VISIT NOTE  Subjective:    Patient ID: Denise Tucker, female    DOB: 05/22/1996, 28 y.o.   MRN: [id number]  HPI  Patient is a 28 y.o. G0P0000 female who presents for evaluation of amenorrhea. She believes she could be pregnant. {pregnancy desire:14500::Pregnancy is desired.} Sexual Activity: {sexual partners:705}. Current symptoms also include: {preg sx:18128}. Last period was {norm/abn:16337}.    Objective:    There were no vitals taken for this visit.  Lab Review  No results found for any visits on 05/17/24.  Assessment:   1. Amenorrhea   2. Encounter for antenatal screening for uncertain dates     Plan:   {AOB + PREGNANCY PLAN:28596:p}  {AOB - PREGNANCY PLAN:28597:p}   Briggitte Boline L Robbi Scurlock, CMA

## 2024-05-17 ENCOUNTER — Ambulatory Visit (INDEPENDENT_AMBULATORY_CARE_PROVIDER_SITE_OTHER)

## 2024-05-17 ENCOUNTER — Other Ambulatory Visit

## 2024-05-17 ENCOUNTER — Other Ambulatory Visit: Payer: Self-pay | Admitting: Licensed Practical Nurse

## 2024-05-17 VITALS — BP 106/74 | HR 77 | Ht 66.0 in | Wt 173.0 lb

## 2024-05-17 DIAGNOSIS — Z3202 Encounter for pregnancy test, result negative: Secondary | ICD-10-CM | POA: Diagnosis not present

## 2024-05-17 DIAGNOSIS — N912 Amenorrhea, unspecified: Secondary | ICD-10-CM

## 2024-05-17 DIAGNOSIS — Z3687 Encounter for antenatal screening for uncertain dates: Secondary | ICD-10-CM

## 2024-05-17 LAB — POCT URINE PREGNANCY: Preg Test, Ur: NEGATIVE

## 2024-05-17 NOTE — Progress Notes (Signed)
 Pt sent mychart message, she has not had a cycle in 52 days, requesting serum beta.  Jinnie Cookey, CNM  [facility] 05/17/24  9:17 AM

## 2024-05-18 LAB — BETA HCG QUANT (REF LAB): hCG Quant: <1 m[IU]/mL

## 2024-06-29 ENCOUNTER — Other Ambulatory Visit: Payer: Self-pay | Admitting: Cardiovascular Disease
